# Patient Record
Sex: Female | Born: 1970 | Race: White | Hispanic: No | Marital: Married | Smoking: Current every day smoker
Health system: Southern US, Community
[De-identification: ages and names within clinical notes are randomized; demographics above are authoritative.]

## PROBLEM LIST (undated history)

## (undated) DIAGNOSIS — G43909 Migraine, unspecified, not intractable, without status migrainosus: Secondary | ICD-10-CM

## (undated) DIAGNOSIS — F32A Depression, unspecified: Secondary | ICD-10-CM

## (undated) DIAGNOSIS — J449 Chronic obstructive pulmonary disease, unspecified: Secondary | ICD-10-CM

## (undated) DIAGNOSIS — F419 Anxiety disorder, unspecified: Secondary | ICD-10-CM

## (undated) DIAGNOSIS — M797 Fibromyalgia: Secondary | ICD-10-CM

## (undated) DIAGNOSIS — F410 Panic disorder [episodic paroxysmal anxiety] without agoraphobia: Secondary | ICD-10-CM

## (undated) HISTORY — DX: Depression, unspecified: F32.A

## (undated) HISTORY — PX: AUGMENTATION MAMMAPLASTY: SUR837

## (undated) HISTORY — DX: Chronic obstructive pulmonary disease, unspecified: J44.9

---

## 2009-09-08 ENCOUNTER — Emergency Department: Payer: Self-pay | Admitting: Emergency Medicine

## 2016-01-15 ENCOUNTER — Other Ambulatory Visit: Payer: Self-pay | Admitting: General Practice

## 2016-01-15 ENCOUNTER — Ambulatory Visit
Admission: RE | Admit: 2016-01-15 | Discharge: 2016-01-15 | Disposition: A | Discharge status: Home / Self Care | Payer: Disability Insurance | Source: Ambulatory Visit | Attending: General Practice | Admitting: General Practice

## 2016-01-15 DIAGNOSIS — M797 Fibromyalgia: Secondary | ICD-10-CM

## 2016-01-15 DIAGNOSIS — M7989 Other specified soft tissue disorders: Secondary | ICD-10-CM | POA: Insufficient documentation

## 2016-01-15 DIAGNOSIS — M50322 Other cervical disc degeneration at C5-C6 level: Secondary | ICD-10-CM | POA: Diagnosis not present

## 2016-01-15 DIAGNOSIS — M5136 Other intervertebral disc degeneration, lumbar region: Secondary | ICD-10-CM | POA: Insufficient documentation

## 2016-01-15 DIAGNOSIS — R51 Headache: Secondary | ICD-10-CM | POA: Insufficient documentation

## 2016-05-12 ENCOUNTER — Emergency Department
Admission: EM | Admit: 2016-05-12 | Discharge: 2016-05-13 | Disposition: A | Discharge status: Home / Self Care | Payer: Disability Insurance | Attending: Emergency Medicine | Admitting: Emergency Medicine

## 2016-05-12 ENCOUNTER — Encounter: Payer: Self-pay | Admitting: *Deleted

## 2016-05-12 DIAGNOSIS — F17200 Nicotine dependence, unspecified, uncomplicated: Secondary | ICD-10-CM | POA: Insufficient documentation

## 2016-05-12 DIAGNOSIS — F1320 Sedative, hypnotic or anxiolytic dependence, uncomplicated: Secondary | ICD-10-CM

## 2016-05-12 DIAGNOSIS — Z79899 Other long term (current) drug therapy: Secondary | ICD-10-CM | POA: Insufficient documentation

## 2016-05-12 DIAGNOSIS — Z79891 Long term (current) use of opiate analgesic: Secondary | ICD-10-CM | POA: Insufficient documentation

## 2016-05-12 DIAGNOSIS — F329 Major depressive disorder, single episode, unspecified: Secondary | ICD-10-CM | POA: Insufficient documentation

## 2016-05-12 DIAGNOSIS — F4320 Adjustment disorder, unspecified: Secondary | ICD-10-CM

## 2016-05-12 DIAGNOSIS — M797 Fibromyalgia: Secondary | ICD-10-CM

## 2016-05-12 DIAGNOSIS — F32A Depression, unspecified: Secondary | ICD-10-CM

## 2016-05-12 DIAGNOSIS — F341 Dysthymic disorder: Secondary | ICD-10-CM

## 2016-05-12 DIAGNOSIS — R45851 Suicidal ideations: Secondary | ICD-10-CM

## 2016-05-12 HISTORY — DX: Panic disorder (episodic paroxysmal anxiety): F41.0

## 2016-05-12 HISTORY — DX: Fibromyalgia: M79.7

## 2016-05-12 HISTORY — DX: Anxiety disorder, unspecified: F41.9

## 2016-05-12 HISTORY — DX: Migraine, unspecified, not intractable, without status migrainosus: G43.909

## 2016-05-12 LAB — URINE DRUG SCREEN, QUALITATIVE (ARMC ONLY)
Amphetamines, Ur Screen: NOT DETECTED
BARBITURATES, UR SCREEN: POSITIVE — AB
Benzodiazepine, Ur Scrn: POSITIVE — AB
CANNABINOID 50 NG, UR ~~LOC~~: NOT DETECTED
COCAINE METABOLITE, UR ~~LOC~~: NOT DETECTED
MDMA (ECSTASY) UR SCREEN: NOT DETECTED
METHADONE SCREEN, URINE: NOT DETECTED
Opiate, Ur Screen: POSITIVE — AB
Phencyclidine (PCP) Ur S: NOT DETECTED
TRICYCLIC, UR SCREEN: NOT DETECTED

## 2016-05-12 LAB — COMPREHENSIVE METABOLIC PANEL
ALT: 13 U/L — AB (ref 14–54)
ANION GAP: 5 (ref 5–15)
AST: 15 U/L (ref 15–41)
Albumin: 4 g/dL (ref 3.5–5.0)
Alkaline Phosphatase: 51 U/L (ref 38–126)
BUN: 15 mg/dL (ref 6–20)
CHLORIDE: 100 mmol/L — AB (ref 101–111)
CO2: 31 mmol/L (ref 22–32)
Calcium: 8.8 mg/dL — ABNORMAL LOW (ref 8.9–10.3)
Creatinine, Ser: 0.74 mg/dL (ref 0.44–1.00)
Glucose, Bld: 85 mg/dL (ref 65–99)
POTASSIUM: 3.9 mmol/L (ref 3.5–5.1)
SODIUM: 136 mmol/L (ref 135–145)
Total Bilirubin: 0.3 mg/dL (ref 0.3–1.2)
Total Protein: 6.7 g/dL (ref 6.5–8.1)

## 2016-05-12 LAB — ETHANOL

## 2016-05-12 LAB — CBC
HCT: 39.2 % (ref 35.0–47.0)
HEMOGLOBIN: 13 g/dL (ref 12.0–16.0)
MCH: 27.5 pg (ref 26.0–34.0)
MCHC: 33.2 g/dL (ref 32.0–36.0)
MCV: 82.6 fL (ref 80.0–100.0)
PLATELETS: 167 10*3/uL (ref 150–440)
RBC: 4.75 MIL/uL (ref 3.80–5.20)
RDW: 13.5 % (ref 11.5–14.5)
WBC: 5.5 10*3/uL (ref 3.6–11.0)

## 2016-05-12 LAB — ACETAMINOPHEN LEVEL

## 2016-05-12 LAB — SALICYLATE LEVEL

## 2016-05-12 NOTE — BH Assessment (Signed)
Assessment Note  Felicia [name] is an 45 y.o. female presenting to the ED under IVC, initiated by [facility] psychiatry,  for suicidal thoughts. She denies suicidal ideations ans states she has not had a suicide attempt in 10 years.  Pt reports she is feeling overwhelmed and anxious because of multiple stressors.  She reports her niece died from cancer.  She also reports she is facing foreclosure and no longer has a job or insurance. She denies any auditory/visual hallucinations.  Diagnosis: Anxiety  Past Medical History:  Past Medical History  Diagnosis Date  . Fibromyalgia   . Anxiety   . Panic disorder   . Migraine     History reviewed. No pertinent past surgical history.  Family History: History reviewed. No pertinent family history.  Social History:  reports that she has been smoking.  She does not have any smokeless tobacco history on file. Her alcohol and drug histories are not on file.  Additional Social History:  Alcohol / Drug Use History of alcohol / drug use?: No history of alcohol / drug abuse (Pt denies)  CIWA: CIWA-Ar BP: 104/80 mmHg Pulse Rate: 65 COWS:    Allergies: No Known Allergies  Home Medications:  (Not in a hospital admission)  OB/GYN Status:  Patient's last menstrual period was 04/26/2016.  General Assessment Data Location of Assessment: [facility] ED TTS Assessment: In system Is this a Tele or Face-to-Face Assessment?: Face-to-Face Is this an Initial Assessment or a Re-assessment for this encounter?: Initial Assessment Marital status: Single Maiden name: N/A Is patient pregnant?: No Pregnancy Status: No Living Arrangements: Alone Can pt return to current living arrangement?: Yes Admission Status: Involuntary Is patient capable of signing voluntary admission?: Yes Referral Source: Psychiatrist Insurance type: Self pay  Medical Screening Exam ([place]) Medical Exam completed: Yes  Crisis Care Plan Living Arrangements: Alone Legal Guardian:  Other: (self) Name of Psychiatrist: Nina Name of Therapist: [facility]  Education Status Is patient currently in school?: No Current Grade: N/A Highest grade of school patient has completed: N/A Name of school: N/A Contact person: N/A  Risk to self with the past 6 months Suicidal Ideation: No Has patient been a risk to self within the past 6 months prior to admission? : No Suicidal Intent: No Has patient had any suicidal intent within the past 6 months prior to admission? : No Is patient at risk for suicide?: No Suicidal Plan?: No Has patient had any suicidal plan within the past 6 months prior to admission? : No Access to Means: No What has been your use of drugs/alcohol within the last 12 months?: None identified Previous Attempts/Gestures: Yes How many times?: 1 Other Self Harm Risks: None identified Triggers for Past Attempts: None known Intentional Self Injurious Behavior: None Family Suicide History: No Recent stressful life event(s): Financial Problems, Job Loss, Loss (Comment) Persecutory voices/beliefs?: No Depression: Yes Depression Symptoms: Despondent, Insomnia, Tearfulness, Loss of interest in usual pleasures, Feeling worthless/self pity Substance abuse history and/or treatment for substance abuse?: No Suicide prevention information given to non-admitted patients: Not applicable  Risk to Others within the past 6 months Homicidal Ideation: No Does patient have any lifetime risk of violence toward others beyond the six months prior to admission? : No Thoughts of Harm to Others: No Current Homicidal Intent: No Current Homicidal Plan: No Access to Homicidal Means: No Identified Victim: None identified History of harm to others?: No Assessment of Violence: None Noted Violent Behavior Description: None identified Does patient have access to weapons?:  No Criminal Charges Pending?: No Does patient have a court date: No Is patient on probation?:  No  Psychosis Hallucinations: None noted Delusions: None noted  Mental Status Report Appearance/Hygiene: In scrubs Eye Contact: Fair Motor Activity: Freedom of movement Speech: Logical/coherent Level of Consciousness: Alert, Crying Mood: Depressed, Anxious Affect: Anxious, Depressed Anxiety Level: Minimal Thought Processes: Coherent, Relevant Judgement: Partial Orientation: Person, Place, Time, Situation, Appropriate for developmental age Obsessive Compulsive Thoughts/Behaviors: None  Cognitive Functioning Concentration: Normal Memory: Recent Intact, Remote Intact IQ: Average Insight: Good Impulse Control: Good Appetite: Fair Weight Loss: 0 Weight Gain: 0 Sleep: Decreased Total Hours of Sleep: 4 Vegetative Symptoms: None  ADLScreening [facility] Assessment Services) Patient's cognitive ability adequate to safely complete daily activities?: Yes Patient able to express need for assistance with ADLs?: Yes Independently performs ADLs?: Yes (appropriate for developmental age)  Prior Inpatient Therapy Prior Inpatient Therapy: Yes Prior Therapy Dates: 2007 Prior Therapy Facilty/Provider(s): [facility] Reason for Treatment: suicide attempt  Prior Outpatient Therapy Prior Outpatient Therapy: Yes Prior Therapy Dates: current Prior Therapy Facilty/Provider(s): [facility] Reason for Treatment: anxiety Does patient have an ACCT team?: No Does patient have Intensive In-House Services?  : No Does patient have [facility] services? : No Does patient have P4CC services?: No  ADL Screening (condition at time of admission) Patient's cognitive ability adequate to safely complete daily activities?: Yes Patient able to express need for assistance with ADLs?: Yes Independently performs ADLs?: Yes (appropriate for developmental age)       Abuse/Neglect Assessment (Assessment to be complete while patient is alone) Physical Abuse: Denies Verbal Abuse: Denies Sexual Abuse: Denies Exploitation of  patient/patient's resources: Denies Self-Neglect: Denies Values / Beliefs Cultural Requests During Hospitalization: None Spiritual Requests During Hospitalization: None Consults Spiritual Care Consult Needed: No Social Work Consult Needed: No      Additional Information 1:1 In Past 12 Months?: No CIRT Risk: No Elopement Risk: No Does patient have medical clearance?: Yes     Disposition:  Disposition Initial Assessment Completed for this Encounter: Yes Disposition of Patient: Other dispositions Other disposition(s): Other (Comment) (Pending Psych MD consult)  On Site Evaluation by:   Reviewed with Physician:    Oneita Hurt 05/12/2016 9:44 PM

## 2016-05-12 NOTE — ED Notes (Signed)
Arrives IVC from [facility] with BPD, pt states she said "I dont want to deal with anything anymore", denies SI at present and states she didn't mean she wanted to kill herself when she said it, states she has a court appearance next week and does not want to miss it, denies HI, denies ETOH and drug abuse

## 2016-05-12 NOTE — ED Notes (Signed)
Dr. McShane at bedside.  

## 2016-05-12 NOTE — ED Notes (Signed)
TTS at bedside. 

## 2016-05-12 NOTE — ED Notes (Signed)
Pt. To BHU from ED ambulatory without difficulty, to room  BHU-5. Report from [facility]. Pt. Is alert and oriented, warm and dry in no distress. Pt. Denies SI, HI, and AVH. Pt. Calm and cooperative. Pt. Made aware of security cameras and Q15 minute rounds. Pt. Encouraged to let Nursing staff know of any concerns or needs.

## 2016-05-12 NOTE — ED Notes (Signed)
Pt dressed out, clothes and cell phone placed in belongings bag

## 2016-05-12 NOTE — ED Provider Notes (Signed)
[facility] Emergency Department Provider Note  ____________________________________________   I have reviewed the triage vital signs and the nursing notes.   HISTORY  Chief Complaint Suicidal    HPI Felicia [name] is a 45 y.o. female with a history of prior suicide attempt anxiety fibromyalgia and depression. She was sent here under IVC by psychiatry for suicidal thoughts. She has not try to kill herself and last 10 years she states. She states she does have passive thoughts of changing places with her niece who died from cancer, she would not endorse active suicidal thoughts but she does admit that she is overwhelmed and anxious all the time. Patient is trying to get on disability for fibromyalgia and anxiety apparently, she is facing foreclosure because he is not working and has numerous life stresses.      Past Medical History  Diagnosis Date  . Fibromyalgia   . Anxiety   . Panic disorder   . Migraine     There are no active problems to display for this patient.   History reviewed. No pertinent past surgical history.  No current outpatient prescriptions on file.  Allergies Review of patient's allergies indicates no known allergies.  History reviewed. No pertinent family history.  Social History Social History  Substance Use Topics  . Smoking status: Current Every Day Smoker  . Smokeless tobacco: None  . Alcohol Use: None    Review of Systems Constitutional: No fever/chills Eyes: No visual changes. ENT: No sore throat. No stiff neck no neck pain Cardiovascular: Denies chest pain. Respiratory: Denies shortness of breath. Gastrointestinal:   no vomiting.  No diarrhea.  No constipation. Genitourinary: Negative for dysuria. Musculoskeletal: Negative lower extremity swelling Skin: Negative for rash. Neurological: Negative for headaches, focal weakness or numbness. 10-point ROS otherwise  negative.  ____________________________________________   PHYSICAL EXAM:  VITAL SIGNS: ED Triage Vitals  Enc Vitals Group     BP 05/12/16 1818 118/78 mmHg     Pulse Rate 05/12/16 1818 73     Resp 05/12/16 1818 18     Temp 05/12/16 1818 98.3 F (36.8 C)     Temp Source 05/12/16 1818 Oral     SpO2 05/12/16 1818 99 %     Weight 05/12/16 1818 140 lb (63.504 kg)     Height 05/12/16 1818 5\' 7"  (1.702 m)     Head Cir --      Peak Flow --      Pain Score --      Pain Loc --      Pain Edu? --      Excl. in [place]? --     Constitutional: Alert and oriented. Well appearing and in no acute distress. Eyes: Conjunctivae are normal. PERRL. EOMI. Head: Atraumatic. Nose: No congestion/rhinnorhea. Mouth/Throat: Mucous membranes are moist.  Oropharynx non-erythematous. Neck: No stridor.   Nontender with no meningismus Cardiovascular: Normal rate, regular rhythm. Grossly normal heart sounds.  Good peripheral circulation. Respiratory: Normal respiratory effort.  No retractions. Lungs CTAB. Abdominal: Soft and nontender. No distention. No guarding no rebound Back:  There is no focal tenderness or step off there is no midline tenderness there are no lesions noted. there is no CVA tenderness Musculoskeletal: No lower extremity tenderness. No joint effusions, no DVT signs strong distal pulses no edema Neurologic:  Normal speech and language. No gross focal neurologic deficits are appreciated.  Skin:  Skin is warm, dry and intact. No rash noted. Psychiatric: Mood and affect are Flat. Speech and  behavior are flat affect and somewhat pressured speech.  ____________________________________________   LABS (all labs ordered are listed, but only abnormal results are displayed)  Labs Reviewed  COMPREHENSIVE METABOLIC PANEL - Abnormal; Notable for the following:    Chloride 100 (*)    Calcium 8.8 (*)    ALT 13 (*)    All other components within normal limits  ACETAMINOPHEN LEVEL - Abnormal; Notable  for the following:    Acetaminophen (Tylenol), Serum <10 (*)    All other components within normal limits  ETHANOL  SALICYLATE LEVEL  CBC  URINE DRUG SCREEN, QUALITATIVE ([facility] ONLY)   ____________________________________________  EKG  I personally interpreted any EKGs ordered by me or triage  ____________________________________________  RADIOLOGY  I reviewed any imaging ordered by me or triage that were performed during my shift and, if possible, patient and/or family made aware of any abnormal findings. ____________________________________________   PROCEDURES  Procedure(s) performed: None  Critical Care performed: None  ____________________________________________   INITIAL IMPRESSION / ASSESSMENT AND PLAN / ED COURSE  Pertinent labs & imaging results that were available during my care of the patient were reviewed by me and considered in my medical decision making (see chart for details).  Patient under IVC by outpatient psychiatry for suicidal thoughts endorses being overwhelmed and over, stress and anxiety to me. Patient IVC will be continued pending psychiatric disposition. ____________________________________________   FINAL CLINICAL IMPRESSION(S) / ED DIAGNOSES  Final diagnoses:  None      This chart was dictated using voice recognition software.  Despite best efforts to proofread,  errors can occur which can change meaning.     Schuyler Amor, MD 05/12/16 [phone]

## 2016-05-13 DIAGNOSIS — F4323 Adjustment disorder with mixed anxiety and depressed mood: Secondary | ICD-10-CM

## 2016-05-13 DIAGNOSIS — F341 Dysthymic disorder: Secondary | ICD-10-CM

## 2016-05-13 DIAGNOSIS — F4320 Adjustment disorder, unspecified: Secondary | ICD-10-CM

## 2016-05-13 DIAGNOSIS — M797 Fibromyalgia: Secondary | ICD-10-CM

## 2016-05-13 DIAGNOSIS — R45851 Suicidal ideations: Secondary | ICD-10-CM

## 2016-05-13 DIAGNOSIS — F1320 Sedative, hypnotic or anxiolytic dependence, uncomplicated: Secondary | ICD-10-CM

## 2016-05-13 MED ORDER — IBUPROFEN 800 MG PO TABS
800.0000 mg | ORAL_TABLET | Freq: Once | ORAL | Status: AC
  Administered 2016-05-13: 800 mg via ORAL
  Filled 2016-05-13: qty 1

## 2016-05-13 NOTE — ED Notes (Signed)
Patient is relieved that she will be able to be discharged from the hospital today. No signs of acute distress at this time. Patient says that her headache and nausea is better. Maintained on 15 minute checks and observation by security camera for safety.

## 2016-05-13 NOTE — ED Notes (Signed)
Patient resting quietly in room. No noted distress or abnormal behaviors noted. Will continue 15 minute checks and observation by security camera for safety. 

## 2016-05-13 NOTE — ED Provider Notes (Signed)
-----------------------------------------   6:22 AM on 05/13/2016 -----------------------------------------   Blood pressure 104/80, pulse 65, temperature 98.3 F (36.8 C), temperature source Oral, resp. rate 17, height 5\' 7"  (1.702 m), weight 140 lb (63.504 kg), last menstrual period 04/26/2016, SpO2 99 %.  The patient had no acute events since last update.  Calm and cooperative at this time.  Disposition is pending per Psychiatry/Behavioral Medicine team recommendations.     Paulette Blanch, MD 05/13/16 [phone]

## 2016-05-13 NOTE — Discharge Instructions (Signed)
Major Depressive Disorder °Major depressive disorder is a mental illness. It also may be called clinical depression or unipolar depression. Major depressive disorder usually causes feelings of sadness, hopelessness, or helplessness. Some people with this disorder do not feel particularly sad but lose interest in doing things they used to enjoy (anhedonia). Major depressive disorder also can cause physical symptoms. It can interfere with work, school, relationships, and other normal everyday activities. The disorder varies in severity but is longer lasting and more serious than the sadness we all feel from time to time in our lives. °Major depressive disorder often is triggered by stressful life events or major life changes. Examples of these triggers include divorce, loss of your job or home, a move, and the death of a family member or close friend. Sometimes this disorder occurs for no obvious reason at all. People who have family members with major depressive disorder or bipolar disorder are at higher risk for developing this disorder, with or without life stressors. Major depressive disorder can occur at any age. It may occur just once in your life (single episode major depressive disorder). It may occur multiple times (recurrent major depressive disorder). °SYMPTOMS °People with major depressive disorder have either anhedonia or depressed mood on nearly a daily basis for at least 2 weeks or longer. Symptoms of depressed mood include: °· Feelings of sadness (blue or down in the dumps) or emptiness. °· Feelings of hopelessness or helplessness. °· Tearfulness or episodes of crying (may be observed by others). °· Irritability (children and adolescents). °In addition to depressed mood or anhedonia or both, people with this disorder have at least four of the following symptoms: °· Difficulty sleeping or sleeping too much.   °· Significant change (increase or decrease) in appetite or weight.   °· Lack of energy or  motivation. °· Feelings of guilt and worthlessness.   °· Difficulty concentrating, remembering, or making decisions. °· Unusually slow movement (psychomotor retardation) or restlessness (as observed by others).   °· Recurrent wishes for death, recurrent thoughts of self-harm (suicide), or a suicide attempt. °People with major depressive disorder commonly have persistent negative thoughts about themselves, other people, and the world. People with severe major depressive disorder may experience distorted beliefs or perceptions about the world (psychotic delusions). They also may see or hear things that are not real (psychotic hallucinations). °DIAGNOSIS °Major depressive disorder is diagnosed through an assessment by your health care provider. Your health care provider will ask about aspects of your daily life, such as mood, sleep, and appetite, to see if you have the diagnostic symptoms of major depressive disorder. Your health care provider may ask about your medical history and use of alcohol or drugs, including prescription medicines. Your health care provider also may do a physical exam and blood work. This is because certain medical conditions and the use of certain substances can cause major depressive disorder-like symptoms (secondary depression). Your health care provider also may refer you to a mental health specialist for further evaluation and treatment. °TREATMENT °It is important to recognize the symptoms of major depressive disorder and seek treatment. The following treatments can be prescribed for this disorder:   °· Medicine. Antidepressant medicines usually are prescribed. Antidepressant medicines are thought to correct chemical imbalances in the brain that are commonly associated with major depressive disorder. Other types of medicine may be added if the symptoms do not respond to antidepressant medicines alone or if psychotic delusions or hallucinations occur. °· Talk therapy. Talk therapy can be  helpful in treating major depressive disorder by providing   support, education, and guidance. Certain types of talk therapy also can help with negative thinking (cognitive behavioral therapy) and with relationship issues that trigger this disorder (interpersonal therapy). A mental health specialist can help determine which treatment is best for you. Most people with major depressive disorder do well with a combination of medicine and talk therapy. Treatments involving electrical stimulation of the brain can be used in situations with extremely severe symptoms or when medicine and talk therapy do not work over time. These treatments include electroconvulsive therapy, transcranial magnetic stimulation, and vagal nerve stimulation.   This information is not intended to replace advice given to you by your health care provider. Make sure you discuss any questions you have with your health care provider.   Document Released: 04/08/2013 Document Revised: 01/02/2015 Document Reviewed: 04/08/2013 Elsevier Interactive Patient Education [employer].    Please follow-up with [facility] return if you're worse again.

## 2016-05-13 NOTE — Consult Note (Signed)
[facility] Consult   Reason for Consult:  Consult for this 45 year old woman referred from [place] because of supposedly suicidal ideation Referring Physician:  Cinda Quest Patient Identification: JENNEL MARA MRN:  [id number] Principal Diagnosis: Adjustment disorder Diagnosis:   Patient Active Problem List   Diagnosis Date Noted  . Dysthymia [F34.1] 05/13/2016  . Adjustment disorder [F43.20] 05/13/2016  . 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 Suicidal ideation [R45.851] 05/13/2016  . Fibromyalgia [M79.7] 05/13/2016    Total Time spent with patient: 1 hour  Subjective:   NASRA COUNCE is a 45 y.o. female patient admitted with "there was a lack of communication".  HPI:  45 year old woman referred here from [place] with reports that she had suicidal ideation. Patient said that she went to see a mental health professional for her disability evaluation earlier this week. They advised her to go to [facility] to see a Social worker. She went there yesterday and spoke to an intake person. She at that time checked off on a box that she had had suicidal ideation. They committed her here to the hospital saying that she had active suicidal thoughts. Patient denies that she said that. She denies having any active suicidal intent or wish or plan. She says that her mood feels down and stressed and has been a little more stressed recently. Multiple problems including having to go to foreclosure court, not being able to work currently, Langley Gauss recently died, she is worried about her daughter and her granddaughter. She says she sleeps okay with her current medicine. Appetite is okay. Denies any hallucinations. She has fibromyalgia and has chronic pain all over. Complains of chronic difficulty with short-term memory. She is on medications that include Lyrica, Cymbalta, MS Contin, Xanax, Fioricet. She says she is compliant with these medicines. Denies that she is abusing alcohol or using any other drugs  abusively.  Social history: Patient lives with her daughter and her granddaughter. She is worried about foreclosure. She has not been able to work in a year because of her difficulty with memory and her fibromyalgia.  Medical history: Diagnosis of fibromyalgia  Substance abuse history: Patient says that she used to drink regularly but hasn't had any alcohol in years. Denies that she abuses any other drugs. She does not seem to have a clear understanding of the a Norma's amount of sedating controlled substance that she is currently taking.  Past Psychiatric History: Patient is not currently seeing a mental health provider. She gets her psychiatric follow-up from her primary care doctor. She is taking Xanax and Cymbalta. She has seen a psychiatrist in the past but it was years ago. She has had one prior suicide attempt by overdose 10 years ago. She had one prior inpatient hospitalization at that time. Previous medications include Zoloft.  Risk to Self: Suicidal Ideation: No Suicidal Intent: No Is patient at risk for suicide?: No Suicidal Plan?: No Access to Means: No What has been your use of drugs/alcohol within the last 12 months?: None identified How many times?: 1 Other Self Harm Risks: None identified Triggers for Past Attempts: None known Intentional Self Injurious Behavior: None Risk to Others: Homicidal Ideation: No Thoughts of Harm to Others: No Current Homicidal Intent: No Current Homicidal Plan: No Access to Homicidal Means: No Identified Victim: None identified History of harm to others?: No Assessment of Violence: None Noted Violent Behavior Description: None identified Does patient have access to weapons?: No Criminal Charges Pending?: No Does patient have a court date: No  Prior Inpatient Therapy: Prior Inpatient Therapy: Yes Prior Therapy Dates: 2007 Prior Therapy Facilty/Provider(s): [facility] Reason for Treatment: suicide attempt Prior Outpatient Therapy: Prior  Outpatient Therapy: Yes Prior Therapy Dates: current Prior Therapy Facilty/Provider(s): [facility] Reason for Treatment: anxiety Does patient have an ACCT team?: No Does patient have Intensive In-House Services?  : No Does patient have [facility] services? : No Does patient have P4CC services?: No  Past Medical History:  Past Medical History  Diagnosis Date  . Fibromyalgia   . Anxiety   . Panic disorder   . Migraine    History reviewed. No pertinent past surgical history. Family History: History reviewed. No pertinent family history. Family Psychiatric  History: Patient says she had a father who had depression. No family history of suicide Social History:  History  Alcohol Use: Not on file     History  Drug Use Not on file    Social History   Social History  . Marital Status: Married    Spouse Name: N/A  . Number of Children: N/A  . Years of Education: N/A   Social History Main Topics  . Smoking status: Current Every Day Smoker  . Smokeless tobacco: None  . Alcohol Use: None  . Drug Use: None  . Sexual Activity: Not Asked   Other Topics Concern  . None   Social History Narrative  . None   Additional Social History:    Allergies:  No Known Allergies  Labs:  Results for orders placed or performed during the hospital encounter of 05/12/16 (from the past 48 hour(s))  Comprehensive metabolic panel     Status: Abnormal   Collection Time: 05/12/16  6:21 PM  Result Value Ref Range   Sodium 136 135 - 145 mmol/L   Potassium 3.9 3.5 - 5.1 mmol/L   Chloride 100 (L) 101 - 111 mmol/L   CO2 31 22 - 32 mmol/L   Glucose, Bld 85 65 - 99 mg/dL   BUN 15 6 - 20 mg/dL   Creatinine, Ser 0.74 0.44 - 1.00 mg/dL   Calcium 8.8 (L) 8.9 - 10.3 mg/dL   Total Protein 6.7 6.5 - 8.1 g/dL   Albumin 4.0 3.5 - 5.0 g/dL   AST 15 15 - 41 U/L   ALT 13 (L) 14 - 54 U/L   Alkaline Phosphatase 51 38 - 126 U/L   Total Bilirubin 0.3 0.3 - 1.2 mg/dL   GFR calc non Af Amer >60 >60 mL/min   GFR calc Af  Amer >60 >60 mL/min    Comment: (NOTE) The eGFR has been calculated using the CKD EPI equation. This calculation has not been validated in all clinical situations. eGFR's persistently <60 mL/min signify possible Chronic Kidney Disease.    Anion gap 5 5 - 15  Ethanol     Status: None   Collection Time: 05/12/16  6:21 PM  Result Value Ref Range   Alcohol, Ethyl (B) <5 <5 mg/dL    Comment:        LOWEST DETECTABLE LIMIT FOR SERUM ALCOHOL IS 5 mg/dL FOR MEDICAL PURPOSES ONLY   Salicylate level     Status: None   Collection Time: 05/12/16  6:21 PM  Result Value Ref Range   Salicylate Lvl <8.6 2.8 - 30.0 mg/dL  Acetaminophen level     Status: Abnormal   Collection Time: 05/12/16  6:21 PM  Result Value Ref Range   Acetaminophen (Tylenol), Serum <10 (L) 10 - 30 ug/mL    Comment:  THERAPEUTIC CONCENTRATIONS VARY SIGNIFICANTLY. A RANGE OF 10-30 ug/mL MAY BE AN EFFECTIVE CONCENTRATION FOR MANY PATIENTS. HOWEVER, SOME ARE BEST TREATED AT CONCENTRATIONS OUTSIDE THIS RANGE. ACETAMINOPHEN CONCENTRATIONS >150 ug/mL AT 4 HOURS AFTER INGESTION AND >50 ug/mL AT 12 HOURS AFTER INGESTION ARE OFTEN ASSOCIATED WITH TOXIC REACTIONS.   cbc     Status: None   Collection Time: 05/12/16  6:21 PM  Result Value Ref Range   WBC 5.5 3.6 - 11.0 K/uL   RBC 4.75 3.80 - 5.20 MIL/uL   Hemoglobin 13.0 12.0 - 16.0 g/dL   HCT 39.2 35.0 - 47.0 %   MCV 82.6 80.0 - 100.0 fL   MCH 27.5 26.0 - 34.0 pg   MCHC 33.2 32.0 - 36.0 g/dL   RDW 13.5 11.5 - 14.5 %   Platelets 167 150 - 440 K/uL  Urine Drug Screen, Qualitative     Status: Abnormal   Collection Time: 05/12/16  6:21 PM  Result Value Ref Range   Tricyclic, Ur Screen NONE DETECTED NONE DETECTED   Amphetamines, Ur Screen NONE DETECTED NONE DETECTED   MDMA (Ecstasy)Ur Screen NONE DETECTED NONE DETECTED   Cocaine Metabolite,Ur [place] NONE DETECTED NONE DETECTED   Opiate, Ur Screen POSITIVE (A) NONE DETECTED   Phencyclidine (PCP) Ur S NONE DETECTED  NONE DETECTED   Cannabinoid 50 Ng, Ur [place] NONE DETECTED NONE DETECTED   Barbiturates, Ur Screen POSITIVE (A) NONE DETECTED   Benzodiazepine, Ur Scrn POSITIVE (A) NONE DETECTED   Methadone Scn, Ur NONE DETECTED NONE DETECTED    Comment: (NOTE) 161  Tricyclics, urine               Cutoff 1000 ng/mL 200  Amphetamines, urine             Cutoff 1000 ng/mL 300  MDMA (Ecstasy), urine           Cutoff 500 ng/mL 400  Cocaine Metabolite, urine       Cutoff 300 ng/mL 500  Opiate, urine                   Cutoff 300 ng/mL 600  Phencyclidine (PCP), urine      Cutoff 25 ng/mL 700  Cannabinoid, urine              Cutoff 50 ng/mL 800  Barbiturates, urine             Cutoff 200 ng/mL 900  Benzodiazepine, urine           Cutoff 200 ng/mL 1000 Methadone, urine                Cutoff 300 ng/mL 1100 1200 The urine drug screen provides only a preliminary, unconfirmed 1300 analytical test result and should not be used for non-medical 1400 purposes. Clinical consideration and professional judgment should 1500 be applied to any positive drug screen result due to possible 1600 interfering substances. A more specific alternate chemical method 1700 must be used in order to obtain a confirmed analytical result.  1800 Gas chromato graphy / mass spectrometry (GC/MS) is the preferred 1900 confirmatory method.     No current facility-administered medications for this encounter.   Current Outpatient Prescriptions  Medication Sig Dispense Refill  . ALPRAZolam (XANAX XR) 2 MG 24 hr tablet Take 2 mg by mouth once.    . butalbital-acetaminophen-caffeine (FIORICET, ESGIC) 50-325-40 MG tablet Take 1 tablet by mouth every 12 (twelve) hours as needed for headache.    . DULoxetine (CYMBALTA) 60 MG  capsule Take 60 mg by mouth daily.    Marland Kitchen morphine (MSIR) 30 MG tablet Take 30 mg by mouth every 4 (four) hours as needed for severe pain.    . pregabalin (LYRICA) 150 MG capsule Take 150 mg by mouth 2 (two) times daily.       Musculoskeletal: Strength & Muscle Tone: decreased Gait & Station: normal Patient leans: N/A  Psychiatric Specialty Exam: Review of Systems  Constitutional: Negative.   HENT: Negative.   Eyes: Negative.   Respiratory: Negative.   Cardiovascular: Negative.   Gastrointestinal: Negative.   Musculoskeletal: Positive for myalgias.  Skin: Negative.   Neurological: Negative.   Psychiatric/Behavioral: Positive for depression and memory loss. Negative for suicidal ideas, hallucinations and substance abuse. The patient is nervous/anxious. The patient does not have insomnia.     Blood pressure 91/57, pulse 52, temperature 98 F (36.7 C), temperature source Oral, resp. rate 18, height _0  (1.702 m), weight 63.504 kg (140 lb), last menstrual period 04/26/2016, SpO2 98 %.Body mass index is 21.92 kg/(m^2).  General Appearance: Disheveled  Eye Contact::  Minimal  Speech:  Slow  Volume:  Decreased  Mood:  Dysphoric  Affect:  Constricted  Thought Process:  Goal Directed  Orientation:  Full (Time, Place, and Person)  Thought Content:  Negative  Suicidal Thoughts:  No  Homicidal Thoughts:  No  Memory:  Immediate;   Good Recent;   Fair Remote;   Fair  Judgement:  Fair  Insight:  Fair  Psychomotor Activity:  Decreased  Concentration:  Fair  Recall:  [employer] of Knowledge:Fair  Language: Fair  Akathisia:  No  Handed:  Right  AIMS (if indicated):     Assets:  Communication Skills Desire for Improvement Housing Resilience  ADL's:  Impaired  Cognition: WNL  Sleep:      Treatment Plan Summary: Plan 45 year old woman who is now denying any suicidal thoughts at all. No evidence that she did anything to try and hurt herself. Patient is not reporting any psychotic symptoms. She Artie has mental health treatment in place and is agreeable to getting further mental health treatment. Patient does not meet commitment criteria. Patient is taking a large amount of very sedating medication.  It is no apprised to me that she has memory problems and she is taking narcotics benzodiazepines and barbiturates on a regular basis as well as her Lyrica. I educated her about this and strongly encouraged that she talk to her doctor and see a psychiatrist about perhaps getting off of some of this medicine since it is probably keeping her sedated. Patient will be taken off IVC. She will be encouraged to go back to follow-up at [facility]. No further inpatient treatment needed at this time.  Disposition: Patient does not meet criteria for psychiatric inpatient admission. Supportive therapy provided about ongoing stressors.  Alethia Berthold, MD 05/13/2016 2:13 PM

## 2016-05-13 NOTE — ED Notes (Signed)
Patient denies SI/HI/AVH. Patient says that she has been feeling depressed lately due to her upcoming forclosure. She adamantly denies that she wants to kill herself and says that her IVC was just a misunderstanding, and wishes to be discharged from the hospital. Patient also complained of headache rated 10/10 and nausea which was addressed with 800 mg ibuprofen and ginger ale. Patient is pleasant and cooperative. Compliant with all scheduled medications. Maintained on 15 minute checks and observation by security camera for safety.

## 2016-05-13 NOTE — ED Notes (Signed)
Patient daughter called to check on patient. Per patient's permission, Probation officer informed daughter that she was waiting to see the psychiatrist and that she would give her a call if she were to be discharged. Daughter had no further questions. Maintained on 15 minute checks and observation by security camera for safety.

## 2016-05-13 NOTE — ED Notes (Signed)
ENVIRONMENTAL ASSESSMENT Potentially harmful objects out of patient reach: Yes Personal belongings secured: Yes Patient dressed in hospital provided attire only: Yes Plastic bags out of patient reach: Yes Patient care equipment (cords, cables, call bells, lines, and drains) shortened, removed, or accounted for: Yes Equipment and supplies removed from bottom of stretcher: Yes Potentially toxic materials out of patient reach: Yes Sharps container removed or out of patient reach: Yes  Patient currently in room sleeping. No signs of distress noted at this time. Maintained on 15 minute checks and observation by security camera for safety.

## 2016-05-13 NOTE — ED Notes (Signed)
Patient denies SI/HI/AVH and pain. All belongings returned to patient. Patient signed belongings inventory sheet. Discharge instructions reviewed. Patient discharged home to daughter.

## 2016-05-13 NOTE — Progress Notes (Signed)
LCSW met with patient and provided her with Felicia [name], [facility] walk-in clinic information and suicide prevention and intervention handout. Patient reports she is not homicidal or suicidal at this time. Patient is able to contract for safety at this time. LCSW reviewed immediate crisis plan with patient. No further needs at this time.  [employer] LCSW [phone]

## 2016-05-13 NOTE — ED Notes (Signed)
Patient asleep in room. No noted distress or abnormal behavior. Will continue 15 minute checks and observation by security cameras for safety. 

## 2016-05-13 NOTE — ED Notes (Signed)
Patient in room resting. No signs of distress noted. Maintained on 15 minute checks and observation by security camera for safety.

## 2016-08-09 ENCOUNTER — Encounter (HOSPITAL_COMMUNITY): Payer: Self-pay | Admitting: *Deleted

## 2016-08-23 ENCOUNTER — Other Ambulatory Visit: Payer: Self-pay | Admitting: Obstetrics and Gynecology

## 2016-08-23 ENCOUNTER — Encounter (HOSPITAL_COMMUNITY): Payer: Self-pay | Admitting: *Deleted

## 2016-08-23 DIAGNOSIS — Z1231 Encounter for screening mammogram for malignant neoplasm of breast: Secondary | ICD-10-CM

## 2016-09-01 ENCOUNTER — Other Ambulatory Visit: Payer: Self-pay | Admitting: Obstetrics and Gynecology

## 2016-09-01 ENCOUNTER — Encounter (HOSPITAL_COMMUNITY): Payer: Self-pay

## 2016-09-01 ENCOUNTER — Ambulatory Visit (HOSPITAL_COMMUNITY)
Admission: RE | Admit: 2016-09-01 | Discharge: 2016-09-01 | Disposition: A | Discharge status: Home / Self Care | Payer: Self-pay | Source: Ambulatory Visit | Attending: Obstetrics and Gynecology | Admitting: Obstetrics and Gynecology

## 2016-09-01 ENCOUNTER — Ambulatory Visit
Admission: RE | Admit: 2016-09-01 | Discharge: 2016-09-01 | Disposition: A | Discharge status: Home / Self Care | Payer: No Typology Code available for payment source | Source: Ambulatory Visit | Attending: Obstetrics and Gynecology | Admitting: Obstetrics and Gynecology

## 2016-09-01 VITALS — BP 106/64 | Temp 98.6°F | Ht 67.0 in | Wt 138.0 lb

## 2016-09-01 DIAGNOSIS — Z1231 Encounter for screening mammogram for malignant neoplasm of breast: Secondary | ICD-10-CM

## 2016-09-01 DIAGNOSIS — Z1239 Encounter for other screening for malignant neoplasm of breast: Secondary | ICD-10-CM

## 2016-09-01 NOTE — Progress Notes (Signed)
No complaints today.   Pap Smear: Pap smear not completed today. Last Pap smear was 08/05/2016 at the [facility] and normal with negative HPV. Per patient has no history of an abnormal Pap smear. Last Pap smear result is in EPIC.  Physical exam: Breasts Breasts symmetrical. No skin abnormalities bilateral breasts. No nipple retraction bilateral breasts. No nipple discharge bilateral breasts. No lymphadenopathy. No lumps palpated bilateral breasts. No complaints of pain or tenderness on exam. Referred patient to the [facility] for a screening mammogram. Appointment scheduled for Thursday, September 01, 2016 at 1240.        Pelvic/Bimanual No Pap smear completed today since last Pap smear was 08/05/2016. Pap smear not indicated per BCCCP guidelines.   Smoking History: Patient is a current smoker. Discussed smoking cessation with patient. Referred patient to the [facility] Quitline and gave resources to free smoking cessation classes at [facility].  Patient Navigation: Patient education provided. Access to services provided for patient through [facility] program.

## 2016-09-01 NOTE — Patient Instructions (Addendum)
Explained breast self awareness to Arna Medici. Patient did not need a Pap smear today due to last Pap smear was 08/05/2016. Let her know BCCCP will cover Pap smears and HPV typing every 5 years unless has a history of abnormal Pap smears. Referred patient to the [facility] for a screening mammogram. Appointment scheduled for Thursday, September 01, 2016 at 1240. Let patient know the [facility] will follow up with her within the next couple weeks with results of mammogram by letter or phone. Discussed smoking cessation with patient. Referred patient to the [facility] Quitline and gave resources to free smoking cessation classes at [facility]. Arna Medici verbalized understanding.  Irwin Toran, Arvil Chaco, RN 10:41 AM

## 2016-09-02 ENCOUNTER — Encounter (HOSPITAL_COMMUNITY): Payer: Self-pay | Admitting: *Deleted

## 2016-09-06 ENCOUNTER — Telehealth (HOSPITAL_COMMUNITY): Payer: Self-pay | Admitting: *Deleted

## 2016-09-06 NOTE — Telephone Encounter (Signed)
Telephoned patient at home number and advised patient would need to call the [facility] Department at [phone] and schedule a physical to get IUD removed. Patient voiced understanding.

## 2018-01-12 ENCOUNTER — Ambulatory Visit: Payer: No Typology Code available for payment source

## 2018-01-12 ENCOUNTER — Other Ambulatory Visit: Payer: Self-pay

## 2018-01-12 ENCOUNTER — Encounter (INDEPENDENT_AMBULATORY_CARE_PROVIDER_SITE_OTHER): Payer: Self-pay

## 2018-01-12 ENCOUNTER — Ambulatory Visit: Payer: No Typology Code available for payment source | Admitting: Pharmacy Technician

## 2018-01-12 DIAGNOSIS — Z79899 Other long term (current) drug therapy: Secondary | ICD-10-CM

## 2018-01-12 NOTE — Progress Notes (Signed)
Completed [facility] application and contract.  Patient agreed to all terms of the [facility] contract.    Patient approved to receive medication assistance at [facility] through 2019, as long as eligibility criteria continues to be met.    Provided patient with community resource material based on her particular needs.    Patient expressed concerns regarding cost for seeing Dr. Hall Busing.  Referred patient to [facility] and [employer].  Patient refused.  Stated that she wanted to continue to see Dr. Hall Busing because he was writing prescriptions for narcotic.    Referred patient to Pain Clinic because patient stated that she has severe back and neck pain.  Patient refused.  [facility]

## 2018-01-12 NOTE — Progress Notes (Signed)
Medication Management Clinic Visit Note  Patient: JAELIE AGUILERA MRN: [id number] Date of Birth: 1971/06/07 PCP: Albina Billet, MD   Arna Medici 47 y.o. female presents for a medication management visit today with the pharmacist.  There were no vitals taken for this visit.  Patient Information   Past Medical History:  Diagnosis Date  . Anxiety   . Fibromyalgia   . Migraine   . Panic disorder       Past Surgical History:  Procedure Laterality Date  . CESAREAN SECTION       Family History  Problem Relation Age of Onset  . Cancer Maternal Uncle        colon  . Diabetes Maternal Grandfather    Outpatient Encounter Medications as of 01/12/2018  Medication Sig  . ALPRAZolam (XANAX XR) 2 MG 24 hr tablet Take 2 mg by mouth 3 (three) times daily.   . butalbital-acetaminophen-caffeine (FIORICET, ESGIC) 50-325-40 MG tablet Take 1 tablet by mouth every 6 (six) hours as needed for headache.   . furosemide (LASIX) 20 MG tablet Take 20 mg by mouth daily as needed for fluid.  Marland Kitchen morphine (MSIR) 30 MG tablet Take 30 mg by mouth daily.   . pregabalin (LYRICA) 150 MG capsule Take 150 mg by mouth 2 (two) times daily.  Marland Kitchen venlafaxine XR (EFFEXOR-XR) 75 MG 24 hr capsule Take 75 mg by mouth daily with breakfast.  . [DISCONTINUED] DULoxetine (CYMBALTA) 60 MG capsule Take 60 mg by mouth daily.   No facility-administered encounter medications on file as of 01/12/2018.      Family Support: Poor and Comments:Patient seems to have poor support system at home. She states that she is responsible for everyone else but that no one helps take care of her.        Social History   Substance and Sexual Activity  Alcohol Use No      Social History   Tobacco Use  Smoking Status Current Every Day Smoker  . Packs/day: 0.25  . Years: 30.00  . Pack years: 7.50  . Types: Cigarettes  Smokeless Tobacco Never Used      Health Maintenance  Topic Date Due  . HIV Screening  11/05/1984  .  TETANUS/TDAP  11/05/1988  . PAP SMEAR  11/05/1990  . INFLUENZA VACCINE  07/26/2017     Assessment and Plan:  Ms. Vega is a 47 yo female who presents today for an MTM and eligibility appointment. During our appointment, she was very anxious and so I primarily focused on her medications. Ms. Stenner was very nervous and spoke about her difficult history including the death of her previous husbands and her past professions. The patient was very difficult to redirect in our conversation and stated that she has had chronic memory issues. Patient is very involved in her daughters care and keeps extensive records of both their medical histories.   Anxiety/Depression: alprazolam, venlafaxine. As stated previously, patient was very anxious today and even took a Xanax during out visit. I assured Ms. Kyser that we were just here to talk at this visit and that there was nothing to be anxious about,. She was anxious that I would call to have her admitted to the hospital because this had been done before at "other places." I stated that I was not going to call and have her held at the hospital for suicidal ideation. She seemed relieved after I said this.   Migraines: Fioricet. Patient states that she periodically has migraines but  that the Fioricet works for her.   Chronic Pain/Fibromyalgia: morphine, pregabalin. She states that she is in chronic pain, but that she is working to lower the amount of morphine that she is taking daily. She states that some days are worse than others for her pain. She also states that she wishes she did not need any of these medications.   Patient also periodically takes Lasix for fluid accumulation.   Lendon Ka, PharmD Pharmacy Resident

## 2018-09-25 ENCOUNTER — Emergency Department: Payer: Medicare HMO

## 2018-09-25 ENCOUNTER — Emergency Department
Admission: EM | Admit: 2018-09-25 | Discharge: 2018-09-25 | Disposition: A | Discharge status: Home / Self Care | Payer: Medicare HMO | Attending: Emergency Medicine | Admitting: Emergency Medicine

## 2018-09-25 ENCOUNTER — Encounter: Payer: Self-pay | Admitting: Emergency Medicine

## 2018-09-25 ENCOUNTER — Other Ambulatory Visit: Payer: Self-pay

## 2018-09-25 DIAGNOSIS — W228XXA Striking against or struck by other objects, initial encounter: Secondary | ICD-10-CM | POA: Diagnosis not present

## 2018-09-25 DIAGNOSIS — F17210 Nicotine dependence, cigarettes, uncomplicated: Secondary | ICD-10-CM | POA: Insufficient documentation

## 2018-09-25 DIAGNOSIS — S0990XA Unspecified injury of head, initial encounter: Secondary | ICD-10-CM | POA: Diagnosis present

## 2018-09-25 DIAGNOSIS — S060X0A Concussion without loss of consciousness, initial encounter: Secondary | ICD-10-CM

## 2018-09-25 DIAGNOSIS — Y9389 Activity, other specified: Secondary | ICD-10-CM | POA: Diagnosis not present

## 2018-09-25 DIAGNOSIS — Y929 Unspecified place or not applicable: Secondary | ICD-10-CM | POA: Insufficient documentation

## 2018-09-25 DIAGNOSIS — Y999 Unspecified external cause status: Secondary | ICD-10-CM | POA: Diagnosis not present

## 2018-09-25 DIAGNOSIS — Z79899 Other long term (current) drug therapy: Secondary | ICD-10-CM | POA: Diagnosis not present

## 2018-09-25 MED ORDER — PROCHLORPERAZINE MALEATE 10 MG PO TABS
10.0000 mg | ORAL_TABLET | Freq: Once | ORAL | Status: AC
  Administered 2018-09-25: 10 mg via ORAL
  Filled 2018-09-25: qty 1

## 2018-09-25 MED ORDER — DIPHENHYDRAMINE HCL 25 MG PO CAPS
25.0000 mg | ORAL_CAPSULE | Freq: Once | ORAL | Status: AC
  Administered 2018-09-25: 25 mg via ORAL
  Filled 2018-09-25: qty 1

## 2018-09-25 MED ORDER — ACETAMINOPHEN 500 MG PO TABS
1000.0000 mg | ORAL_TABLET | Freq: Once | ORAL | Status: AC
  Administered 2018-09-25: 1000 mg via ORAL
  Filled 2018-09-25: qty 2

## 2018-09-25 NOTE — ED Notes (Signed)
Spoke with MD Quentin Cornwall, see verbal orders.

## 2018-09-25 NOTE — ED Provider Notes (Signed)
[facility] Emergency Department Provider Note ____________________________________________   First MD Initiated Contact with Patient 09/25/18 2054     (approximate)  I have reviewed the triage vital signs and the nursing notes.   HISTORY  Chief Complaint Headache   HPI Felicia [name] is a 47 y.o. female of anxiety, fibromyalgia and migraine who was presented emergency department with intermittent headaches over the past 4 days.  Says that she was riding a tractor this past Saturday that was bouncing frequently when she hit her head on the ceiling of the enclosure of the tractor.  She does not report losing consciousness.  Says that she has been having intermittent headaches since.  Says that her headache is a 5 out of 10 right now and feels like a lightening bolt from the front of her head to her back of her head on the left side of her head mostly.  Says that she has had butalbital at home which has been helping intermittently.  Says that it also helps for her to sleep but then when she gets up to walk around the pain resumes.  Denies any pain in her neck.  Says that it is associated with nausea as well as photophobia.  Says that she has had migraines in the past with similar characteristics and was at urgent care earlier today and sent to the emergency department for further evaluation.  Given p.o. Compazine as well as Benadryl and Tylenol with only minimal relief at approximately 6 PM this evening.  Does not report any numbness or tingling.  Denies any focal weakness.   Past Medical History:  Diagnosis Date  . Anxiety   . Fibromyalgia   . Migraine   . Panic disorder     Patient Active Problem List   Diagnosis Date Noted  . Dysthymia 05/13/2016  . Adjustment disorder 05/13/2016  . Sedative dependence ([place]) 05/13/2016  . Suicidal ideation 05/13/2016  . Fibromyalgia 05/13/2016    Past Surgical History:  Procedure Laterality Date  . CESAREAN SECTION       Prior to Admission medications   Medication Sig Start Date End Date Taking? Authorizing Provider  ALPRAZolam (XANAX XR) 2 MG 24 hr tablet Take 2 mg by mouth 3 (three) times daily.     [provider]  butalbital-acetaminophen-caffeine (FIORICET, ESGIC) 50-325-40 MG tablet Take 1 tablet by mouth every 6 (six) hours as needed for headache.     [provider]  furosemide (LASIX) 20 MG tablet Take 20 mg by mouth daily as needed for fluid.    [provider]  morphine (MSIR) 30 MG tablet Take 30 mg by mouth daily.     [provider]  pregabalin (LYRICA) 150 MG capsule Take 150 mg by mouth 2 (two) times daily.    [provider]  venlafaxine XR (EFFEXOR-XR) 75 MG 24 hr capsule Take 75 mg by mouth daily with breakfast.    [provider]    Allergies Patient has no known allergies.  Family History  Problem Relation Age of Onset  . Cancer Maternal Uncle        colon  . Diabetes Maternal Grandfather     Social History Social History   Tobacco Use  . Smoking status: Current Every Day Smoker    Packs/day: 0.25    Years: 30.00    Pack years: 7.50    Types: Cigarettes  . Smokeless tobacco: Never Used  Substance Use Topics  . Alcohol use: No  .  Drug use: No    Review of Systems  Constitutional: No fever/chills Eyes: No visual changes. ENT: No sore throat. Cardiovascular: Denies chest pain. Respiratory: Denies shortness of breath. Gastrointestinal: No abdominal pain.  No nausea, no vomiting.  No diarrhea.  No constipation. Genitourinary: Negative for dysuria. Musculoskeletal: Negative for back pain. Skin: Negative for rash. Neurological: Negative for focal weakness or numbness.   ____________________________________________   PHYSICAL EXAM:  VITAL SIGNS: ED Triage Vitals [09/25/18 1804]  Enc Vitals Group     BP 124/88     Pulse      Resp 16     Temp 98.7 F (37.1 C)     Temp Source Oral     SpO2 96 %      Weight 128 lb (58.1 kg)     Height 5\' 5"  (1.651 m)     Head Circumference      Peak Flow      Pain Score 10     Pain Loc      Pain Edu?      Excl. in [place]?     Constitutional: Alert and oriented.  Patient holding hands over her eyes to block the light. Eyes: Conjunctivae are normal.  Pupils are PERRL.  No nystagmus. Head: Atraumatic. Nose: No congestion/rhinnorhea. Mouth/Throat: Mucous membranes are moist.  Neck: No stridor.  Tenderness over the left trapezius muscle which she says feels improved with palpation. Cardiovascular: Normal rate, regular rhythm. Grossly normal heart sounds.   Respiratory: Normal respiratory effort.  No retractions. Lungs CTAB. Gastrointestinal: Soft and nontender. No distention.  Musculoskeletal: No lower extremity tenderness nor edema.  No joint effusions. Neurologic:  Normal speech and language. No gross focal neurologic deficits are appreciated. Skin:  Skin is warm, dry and intact. No rash noted. Psychiatric: Mood and affect are normal. Speech and behavior are normal.  ____________________________________________   LABS (all labs ordered are listed, but only abnormal results are displayed)  Labs Reviewed - No data to display ____________________________________________  EKG   ____________________________________________  RADIOLOGY  CT head without acute pathology. ____________________________________________   PROCEDURES  Procedure(s) performed:   Procedures  Critical Care performed:   ____________________________________________   INITIAL IMPRESSION / ASSESSMENT AND PLAN / ED COURSE  Pertinent labs & imaging results that were available during my care of the patient were reviewed by me and considered in my medical decision making (see chart for details).  Differential diagnosis includes, but is not limited to, intracranial hemorrhage, meningitis/encephalitis, previous head trauma, cavernous venous thrombosis, tension headache,  temporal arteritis, migraine or migraine equivalent, idiopathic intracranial hypertension, and non-specific headache. As part of my medical decision making, I reviewed the following data within the electronic MEDICAL RECORD NUMBER Notes from prior ED visits  ----------------------------------------- 10:17 PM on 09/25/2018 -----------------------------------------  Patient updated regarding her head CT which is reassuring.  Continues to be neurologically intact without any focal findings.  Offered IV medications but she says that she is in constant pain from her fibromyalgia and was referred here by urgent care for CT and would just like her CT results.  Multiple days of symptoms without neurologic deficits.  No seizure-like episodes.  Unlikely to be aneurysmal issue.  Patient says that she has a refill of Fioricet waiting at the pharmacy for her.  Patient no longer holding her hand over her eyes.  Appears well.  Will be discharged home at this time. ____________________________________________   FINAL CLINICAL IMPRESSION(S) / ED DIAGNOSES  Concussion.    NEW MEDICATIONS STARTED  DURING THIS VISIT:  New Prescriptions   No medications on file     Note:  This document was prepared using Dragon voice recognition software and may include unintentional dictation errors.     Orbie Pyo, MD 09/25/18 2218

## 2018-09-25 NOTE — ED Triage Notes (Signed)
PT from [facility], c/o headache xfew days. Sensitivity to light and sounds, hx of migraines. VSS, ambulatory

## 2018-09-25 NOTE — ED Notes (Signed)
Pharm sending up PO compazine

## 2018-09-25 NOTE — ED Notes (Signed)
Markus Jarvis (Daughter) [phone]

## 2018-10-25 ENCOUNTER — Ambulatory Visit: Payer: Medicare HMO | Admitting: Podiatry

## 2018-10-25 ENCOUNTER — Encounter: Payer: Self-pay | Admitting: Podiatry

## 2018-10-25 VITALS — BP 101/67 | HR 82

## 2018-10-25 DIAGNOSIS — L600 Ingrowing nail: Secondary | ICD-10-CM

## 2018-10-25 MED ORDER — NEOMYCIN-POLYMYXIN-HC 3.5-10000-1 OT SOLN: OTIC | 0 refills | Status: DC

## 2018-10-25 NOTE — Patient Instructions (Signed)

## 2018-10-25 NOTE — Progress Notes (Signed)
Subjective:   Patient ID: Felicia [name], female   DOB: 47 y.o.   MRN: [id number]   HPI Patient presents with painful ingrown toenail right big toe stating she is been doing pedicurist has not been able to fix it and is been going on now for several months.  She is tried to trim and soak it without relief and patient does not smoke and likes to be active   Review of Systems  All other systems reviewed and are negative.       Objective:  Physical Exam  Constitutional: She appears well-developed and well-nourished.  Cardiovascular: Intact distal pulses.  Pulmonary/Chest: Effort normal.  Musculoskeletal: Normal range of motion.  Neurological: She is alert.  Skin: Skin is warm.  Nursing note and vitals reviewed.   Neurovascular status found to be intact muscle strength is adequate range of motion within normal limits with patient right hallux medial border found to be incurvated and painful when palpated.  There is no redness or drainage noted and it was deep into the nailbed that is quite sore and irritated.  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  I explained procedure and risk and patient wants surgery and today I infiltrated the right hallux 60 mg like Marcaine mixture remove the medial border exposed matrix and applied phenol 3 applications 30 seconds after sterile prep of the area and using sterile instrumentation.  I applied sterile dressing gave instructions for soaks and encouraged her to leave the dressing on for 24 hours but to take it off earlier if any throbbing should occur.  Patient was given a prescription for Cortisporin otic solution and encouraged to call with any questions concerns she may have

## 2018-10-31 ENCOUNTER — Emergency Department: Payer: Medicare HMO

## 2018-10-31 ENCOUNTER — Encounter: Payer: Self-pay | Admitting: Emergency Medicine

## 2018-10-31 ENCOUNTER — Other Ambulatory Visit: Payer: Self-pay

## 2018-10-31 ENCOUNTER — Emergency Department
Admission: EM | Admit: 2018-10-31 | Discharge: 2018-10-31 | Disposition: A | Discharge status: Home / Self Care | Payer: Medicare HMO | Attending: Student in an Organized Health Care Education/Training Program | Admitting: Student in an Organized Health Care Education/Training Program

## 2018-10-31 DIAGNOSIS — R05 Cough: Secondary | ICD-10-CM

## 2018-10-31 DIAGNOSIS — R059 Cough, unspecified: Secondary | ICD-10-CM

## 2018-10-31 DIAGNOSIS — Z79899 Other long term (current) drug therapy: Secondary | ICD-10-CM | POA: Diagnosis not present

## 2018-10-31 DIAGNOSIS — K1370 Unspecified lesions of oral mucosa: Secondary | ICD-10-CM | POA: Diagnosis present

## 2018-10-31 DIAGNOSIS — F17210 Nicotine dependence, cigarettes, uncomplicated: Secondary | ICD-10-CM | POA: Diagnosis not present

## 2018-10-31 DIAGNOSIS — B085 Enteroviral vesicular pharyngitis: Secondary | ICD-10-CM | POA: Diagnosis not present

## 2018-10-31 LAB — COMPREHENSIVE METABOLIC PANEL
ALBUMIN: 3.8 g/dL (ref 3.5–5.0)
ALT: 18 U/L (ref 0–44)
AST: 22 U/L (ref 15–41)
Alkaline Phosphatase: 60 U/L (ref 38–126)
Anion gap: 9 (ref 5–15)
BUN: 17 mg/dL (ref 6–20)
CHLORIDE: 100 mmol/L (ref 98–111)
CO2: 27 mmol/L (ref 22–32)
CREATININE: 0.53 mg/dL (ref 0.44–1.00)
Calcium: 8.8 mg/dL — ABNORMAL LOW (ref 8.9–10.3)
GFR calc Af Amer: >60 mL/min (ref >60)
GFR calc non Af Amer: >60 mL/min (ref >60)
GLUCOSE: 128 mg/dL — AB (ref 70–99)
Potassium: 3.8 mmol/L (ref 3.5–5.1)
SODIUM: 136 mmol/L (ref 135–145)
Total Bilirubin: 0.6 mg/dL (ref 0.3–1.2)
Total Protein: 7 g/dL (ref 6.5–8.1)

## 2018-10-31 LAB — URINALYSIS, COMPLETE (UACMP) WITH MICROSCOPIC
Bacteria, UA: NONE SEEN
Bilirubin Urine: NEGATIVE
GLUCOSE, UA: NEGATIVE mg/dL
Hgb urine dipstick: NEGATIVE
Ketones, ur: NEGATIVE mg/dL
Leukocytes, UA: NEGATIVE
Nitrite: NEGATIVE
Protein, ur: NEGATIVE mg/dL
Specific Gravity, Urine: 1.008 (ref 1.005–1.030)
WBC UA: NONE SEEN WBC/hpf (ref 0–5)
pH: 5 (ref 5.0–8.0)

## 2018-10-31 LAB — CBC
HEMATOCRIT: 37.4 % (ref 36.0–46.0)
Hemoglobin: 12.4 g/dL (ref 12.0–15.0)
MCH: 29.4 pg (ref 26.0–34.0)
MCHC: 33.2 g/dL (ref 30.0–36.0)
MCV: 88.6 fL (ref 80.0–100.0)
Platelets: 254 10*3/uL (ref 150–400)
RBC: 4.22 MIL/uL (ref 3.87–5.11)
RDW: 14.5 % (ref 11.5–15.5)
WBC: 8.2 10*3/uL (ref 4.0–10.5)
nRBC: 0 % (ref 0.0–0.2)

## 2018-10-31 LAB — TROPONIN I: Troponin I: <0.03 ng/mL (ref <0.03)

## 2018-10-31 LAB — LIPASE, BLOOD: Lipase: 48 U/L (ref 11–51)

## 2018-10-31 MED ORDER — LIDOCAINE VISCOUS HCL 2 % MT SOLN: 15.0000 mL | Freq: Four times a day (QID) | OROMUCOSAL | 1 refills | Status: DC | PRN

## 2018-10-31 MED ORDER — LIDOCAINE VISCOUS HCL 2 % MT SOLN: 15.0000 mL | Freq: Four times a day (QID) | OROMUCOSAL | 0 refills | Status: DC | PRN

## 2018-10-31 MED ORDER — LIDOCAINE VISCOUS HCL 2 % MT SOLN
15.0000 mL | Freq: Once | OROMUCOSAL | Status: AC
  Administered 2018-10-31: 15 mL via OROMUCOSAL
  Filled 2018-10-31: qty 15

## 2018-10-31 NOTE — ED Notes (Addendum)
Pt has "bumps on her tongue" and is afraid she has been "misdiagnosed" after going through 2 rounds of antibiotics for bronchitis. Also concerned that she may be contagious to others (several family members have been diagnosed with flu and strep in the last few days).

## 2018-10-31 NOTE — ED Notes (Signed)
Pt retching in room; offered to see about getting her some nausea medication. She states that she has medication but is waiting to see if she is going to "get the cocktail" before taking it. She stated that she is looking for an answer to the question of whether or not she should take her home meds for her migraine (which is not why she came in) and that no one will give her an answer. This nurse told her that she should not take any medications until the ED doctor has examined her. Pt verbalized understanding.

## 2018-10-31 NOTE — ED Provider Notes (Signed)
[facility] Emergency Department Provider Note    First MD Initiated Contact with Patient 10/31/18 1832     (approximate)  I have reviewed the triage vital signs and the nursing notes.   HISTORY  Chief Complaint No chief complaint on file.    HPI Felicia [name] is a 47 y.o. female with a history of anxiety and fibromyalgia as well as migraine recently treated for bronchitis with 2 rounds of antibiotics steroids and albuterol inhalers presents the ER with chief complaint of "bumps on her tongue "as well as pain in the back of her mouth.  Patient states she was also having a headache.  States she has a history of recurrent migraines and this is consistent with previous migraines.  Denies any fevers at home.  States that she has having pain associated with coughing.  States that she does smoke and vape.    Past Medical History:  Diagnosis Date  . Anxiety   . Fibromyalgia   . Migraine   . Panic disorder    Family History  Problem Relation Age of Onset  . Cancer Maternal Uncle        colon  . Diabetes Maternal Grandfather    Past Surgical History:  Procedure Laterality Date  . CESAREAN SECTION     Patient Active Problem List   Diagnosis Date Noted  . Dysthymia 05/13/2016  . Adjustment disorder 05/13/2016  . 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 Suicidal ideation 05/13/2016  . Fibromyalgia 05/13/2016      Prior to Admission medications   Medication Sig Start Date End Date Taking? Authorizing Provider  albuterol (PROVENTIL HFA;VENTOLIN HFA) 108 (90 Base) MCG/ACT inhaler Inhale into the lungs. 10/23/18   [provider]  ALPRAZolam (XANAX XR) 2 MG 24 hr tablet Take 2 mg by mouth 3 (three) times daily.     [provider]  azithromycin (ZITHROMAX) 250 MG tablet TAKE 2 TABLETS BY MOUTH ON DAY 1 AND THEN TAKE 1 TABLET BY MOUTH ONCE A DAY ON DAY 2 THROUGH DAY 5 10/17/18   [provider]  butalbital-acetaminophen-caffeine  (FIORICET, ESGIC) 50-325-40 MG tablet Take 1 tablet by mouth every 6 (six) hours as needed for headache.     [provider]  furosemide (LASIX) 20 MG tablet Take 20 mg by mouth daily as needed for fluid.    [provider]  lidocaine (XYLOCAINE) 2 % solution Use as directed 15 mLs in the mouth or throat every 6 (six) hours as needed for mouth pain. 10/31/18   Merlyn Lot, MD  morphine (MSIR) 30 MG tablet Take 30 mg by mouth daily.     [provider]  neomycin-polymyxin-hydrocortisone (CORTISPORIN) OTIC solution Apply 1-2 drops to the toe after soaking toe twice a day 10/25/18   Wallene Huh, DPM  predniSONE (DELTASONE) 10 MG tablet 6 PO Q D X 1 DAY, THEN 5 PO Q D X 1 DAY, THEN 4 PO Q D X 1 DAY, THEN 3 PO Q D X 1 DAY, THEN 2 PO Q D X 1 DAY, THEN 1 PO Q D X 1 DAY 10/23/18   [provider]  pregabalin (LYRICA) 150 MG capsule Take 150 mg by mouth 2 (two) times daily.    [provider]  venlafaxine XR (EFFEXOR-XR) 75 MG 24 hr capsule Take 75 mg by mouth daily with breakfast.    [provider]    Allergies Patient has no known allergies.    Social  History Social History   Tobacco Use  . Smoking status: Current Every Day Smoker    Packs/day: 0.25    Years: 30.00    Pack years: 7.50    Types: Cigarettes  . Smokeless tobacco: Never Used  Substance Use Topics  . Alcohol use: No  . Drug use: No    Review of Systems Patient denies headaches, rhinorrhea, blurry vision, numbness, shortness of breath, chest pain, edema, cough, abdominal pain, nausea, vomiting, diarrhea, dysuria, fevers, rashes or hallucinations unless otherwise stated above in HPI. ____________________________________________   PHYSICAL EXAM:  VITAL SIGNS: Vitals:   10/31/18 1643  BP: 120/78  Pulse: 92  Resp: 16  Temp: 98.4 F (36.9 C)  SpO2: 100%    Constitutional: Alert and oriented, anxious appearing but in no acute distress.  Eyes: Conjunctivae  are normal.  Head: Atraumatic. Nose: No congestion/rhinnorhea. Mouth/Throat: Mucous membranes are moist.  Herpangina appearing lesions in the posterior oropharynx, no exudates, tonsils are nml bilaterally, no uvular edema Neck: No stridor. Painless ROM.  Cardiovascular: Normal rate, regular rhythm. Grossly normal heart sounds.  Good peripheral circulation. Respiratory: Normal respiratory effort.  No retractions. Lungs CTAB. Gastrointestinal: Soft and nontender. No distention. No abdominal bruits. No CVA tenderness. Genitourinary:  Musculoskeletal: No lower extremity tenderness nor edema.  No joint effusions. Neurologic:  Normal speech and language. No gross focal neurologic deficits are appreciated. No facial droop Skin:  Skin is warm, dry and intact. No rash noted. Psychiatric: Mood and affect are normal. Speech and behavior are normal.  ____________________________________________   LABS (all labs ordered are listed, but only abnormal results are displayed)  Results for orders placed or performed during the hospital encounter of 10/31/18 (from the past 24 hour(s))  Urinalysis, Complete w Microscopic     Status: Abnormal   Collection Time: 10/31/18  4:52 PM  Result Value Ref Range   Color, Urine COLORLESS (A) YELLOW   APPearance CLEAR (A) CLEAR   Specific Gravity, Urine 1.008 1.005 - 1.030   pH 5.0 5.0 - 8.0   Glucose, UA NEGATIVE NEGATIVE mg/dL   Hgb urine dipstick NEGATIVE NEGATIVE   Bilirubin Urine NEGATIVE NEGATIVE   Ketones, ur NEGATIVE NEGATIVE mg/dL   Protein, ur NEGATIVE NEGATIVE mg/dL   Nitrite NEGATIVE NEGATIVE   Leukocytes, UA NEGATIVE NEGATIVE   RBC / HPF 0-5 0 - 5 RBC/hpf   WBC, UA NONE SEEN 0 - 5 WBC/hpf   Bacteria, UA NONE SEEN NONE SEEN   Squamous Epithelial / LPF 0-5 0 - 5   Mucus PRESENT   CBC     Status: None   Collection Time: 10/31/18  5:45 PM  Result Value Ref Range   WBC 8.2 4.0 - 10.5 K/uL   RBC 4.22 3.87 - 5.11 MIL/uL   Hemoglobin 12.4 12.0 - 15.0  g/dL   HCT 37.4 36.0 - 46.0 %   MCV 88.6 80.0 - 100.0 fL   MCH 29.4 26.0 - 34.0 pg   MCHC 33.2 30.0 - 36.0 g/dL   RDW 14.5 11.5 - 15.5 %   Platelets 254 150 - 400 K/uL   nRBC 0.0 0.0 - 0.2 %  Comprehensive metabolic panel     Status: Abnormal   Collection Time: 10/31/18  5:45 PM  Result Value Ref Range   Sodium 136 135 - 145 mmol/L   Potassium 3.8 3.5 - 5.1 mmol/L   Chloride 100 98 - 111 mmol/L   CO2 27 22 - 32 mmol/L   Glucose, Bld 128 (H)  70 - 99 mg/dL   BUN 17 6 - 20 mg/dL   Creatinine, Ser 0.53 0.44 - 1.00 mg/dL   Calcium 8.8 (L) 8.9 - 10.3 mg/dL   Total Protein 7.0 6.5 - 8.1 g/dL   Albumin 3.8 3.5 - 5.0 g/dL   AST 22 15 - 41 U/L   ALT 18 0 - 44 U/L   Alkaline Phosphatase 60 38 - 126 U/L   Total Bilirubin 0.6 0.3 - 1.2 mg/dL   GFR calc non Af Amer >60 >60 mL/min   GFR calc Af Amer >60 >60 mL/min   Anion gap 9 5 - 15  Lipase, blood     Status: None   Collection Time: 10/31/18  5:45 PM  Result Value Ref Range   Lipase 48 11 - 51 U/L  Troponin I     Status: None   Collection Time: 10/31/18  5:45 PM  Result Value Ref Range   Troponin I <0.03 <0.03 ng/mL   ____________________________________________  EKG My review and personal interpretation at Time: 19:19   Indication: sob  Rate: 60 Rhythm: sinus Axis: normal Other: normal intervals, no stemi ____________________________________________  RADIOLOGY  I personally reviewed all radiographic images ordered to evaluate for the above acute complaints and reviewed radiology reports and findings.  These findings were personally discussed with the patient.  Please see medical record for radiology report.  ____________________________________________   PROCEDURES  Procedure(s) performed:  Procedures    Critical Care performed: no ____________________________________________   INITIAL IMPRESSION / ASSESSMENT AND PLAN / ED COURSE  Pertinent labs & imaging results that were available during my care of the patient  were reviewed by me and considered in my medical decision making (see chart for details).   DDX: Herpangina, bronchitis, COPD, anemia, ACS, congestive heart failure, PE, pneumonia  LAILYNN SOUTHGATE is a 47 y.o. who presents to the ED with symptoms as described above.  Patient nontoxic-appearing afebrile Heema dynamically stable.  She is low risk by Wells criteria and is PERC negative.  No wheezing on exam.  No respiratory distress.  Does seem mildly anxious.  Blood work will be sent for the above differential.  Her bumps on her tongue seem most clinically consistent with herpangina.  Will be given viscous lidocaine.  Clinical Course as of Nov 01 1943  Wed Oct 31, 2018  1924 Is reassuring.  EKG without evidence of acute ischemia.  This point I do believe patient stable and appropriate for outpatient follow-up.   [PR]    Clinical Course User Index [PR] Merlyn Lot, MD     As part of my medical decision making, I reviewed the following data within the [place] notes reviewed and incorporated, Labs reviewed, notes from prior ED visits and [place] Controlled Substance Database   ____________________________________________   FINAL CLINICAL IMPRESSION(S) / ED DIAGNOSES  Final diagnoses:  Herpangina  Cough      NEW MEDICATIONS STARTED DURING THIS VISIT:  Current Discharge Medication List    START taking these medications   Details  lidocaine (XYLOCAINE) 2 % solution Use as directed 15 mLs in the mouth or throat every 6 (six) hours as needed for mouth pain. Qty: 150 mL, Refills: 1         Note:  This document was prepared using Dragon voice recognition software and may include unintentional dictation errors.    Merlyn Lot, MD 10/31/18 1944

## 2018-10-31 NOTE — ED Triage Notes (Signed)
Says bronchitis and has tanke z pack and then amoxicillin/pred.  This is third week. She says she has family membes with strep and  Is worried about that.  Went to doctor last night and they said she still had bronchitis and gave inhaler and wanted to give prednisone again.  Says now her tongue has rash on it.  Diarrhea many times.,  Vomiting since she was at dr last night.

## 2018-10-31 NOTE — Discharge Instructions (Addendum)
Please be sure to use your inhaler 2 puffs every 4-6 hours while awake.  I have given you referral to pulmonologist.  Please return if you have any worsening symptoms or any additional questions or concerns.

## 2018-10-31 NOTE — ED Notes (Signed)
Pt discharged home after verbalizing understanding of discharge instructions; nad noted. 

## 2018-11-18 ENCOUNTER — Other Ambulatory Visit: Payer: Self-pay

## 2018-11-18 ENCOUNTER — Emergency Department
Admission: EM | Admit: 2018-11-18 | Discharge: 2018-11-18 | Disposition: A | Discharge status: Home / Self Care | Payer: Medicare HMO | Attending: Emergency Medicine | Admitting: Emergency Medicine

## 2018-11-18 ENCOUNTER — Emergency Department: Payer: Medicare HMO

## 2018-11-18 DIAGNOSIS — R51 Headache: Secondary | ICD-10-CM | POA: Diagnosis not present

## 2018-11-18 DIAGNOSIS — S20211A Contusion of right front wall of thorax, initial encounter: Secondary | ICD-10-CM | POA: Diagnosis not present

## 2018-11-18 DIAGNOSIS — S299XXA Unspecified injury of thorax, initial encounter: Secondary | ICD-10-CM | POA: Diagnosis present

## 2018-11-18 DIAGNOSIS — R52 Pain, unspecified: Secondary | ICD-10-CM

## 2018-11-18 DIAGNOSIS — F17210 Nicotine dependence, cigarettes, uncomplicated: Secondary | ICD-10-CM | POA: Diagnosis not present

## 2018-11-18 DIAGNOSIS — Y92410 Unspecified street and highway as the place of occurrence of the external cause: Secondary | ICD-10-CM | POA: Diagnosis not present

## 2018-11-18 DIAGNOSIS — Y999 Unspecified external cause status: Secondary | ICD-10-CM | POA: Insufficient documentation

## 2018-11-18 DIAGNOSIS — Z79899 Other long term (current) drug therapy: Secondary | ICD-10-CM | POA: Insufficient documentation

## 2018-11-18 DIAGNOSIS — Y9389 Activity, other specified: Secondary | ICD-10-CM | POA: Diagnosis not present

## 2018-11-18 DIAGNOSIS — S161XXA Strain of muscle, fascia and tendon at neck level, initial encounter: Secondary | ICD-10-CM | POA: Insufficient documentation

## 2018-11-18 LAB — POCT PREGNANCY, URINE: Preg Test, Ur: NEGATIVE

## 2018-11-18 MED ORDER — IBUPROFEN 800 MG PO TABS
800.0000 mg | ORAL_TABLET | Freq: Once | ORAL | Status: AC
  Administered 2018-11-18: 800 mg via ORAL
  Filled 2018-11-18: qty 1
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
## 2018-11-18 NOTE — Discharge Instructions (Addendum)
No serious injury is suspected.  Return to the emergency department immediately for any worsening condition including trouble breathing, chest pain, abdominal pain, weakness or numbness, confusion altered mental status, or any other symptoms concerning to you.  You may try over-the-counter ibuprofen 800 mg every 8 hours as needed for anti-inflammatory/pain control, use as directed on labeling.
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
## 2018-11-18 NOTE — ED Notes (Signed)
Patient transported to X-ray 

## 2018-11-18 NOTE — ED Notes (Signed)
Pt assisted to bathroom

## 2018-11-18 NOTE — ED Notes (Signed)
Return from Xray
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
## 2018-12-10 ENCOUNTER — Telehealth: Payer: Self-pay | Admitting: Pharmacy Technician

## 2018-12-10 NOTE — Telephone Encounter (Signed)
Patient has Medicaid.  No longer meets [facility]'s eligibility criteria.  Patient notified by letter.  [facility]

## 2019-01-14 ENCOUNTER — Encounter: Payer: No Typology Code available for payment source | Admitting: Pharmacist
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
## 2020-06-03 ENCOUNTER — Other Ambulatory Visit: Payer: Self-pay | Admitting: Internal Medicine

## 2020-06-03 DIAGNOSIS — Z1231 Encounter for screening mammogram for malignant neoplasm of breast: Secondary | ICD-10-CM

## 2020-06-12 ENCOUNTER — Ambulatory Visit
Admission: RE | Admit: 2020-06-12 | Discharge: 2020-06-12 | Disposition: A | Discharge status: Home / Self Care | Payer: Medicare HMO | Source: Ambulatory Visit | Attending: Internal Medicine | Admitting: Internal Medicine

## 2020-06-12 DIAGNOSIS — Z1231 Encounter for screening mammogram for malignant neoplasm of breast: Secondary | ICD-10-CM | POA: Diagnosis present

## 2020-07-02 ENCOUNTER — Ambulatory Visit (INDEPENDENT_AMBULATORY_CARE_PROVIDER_SITE_OTHER): Payer: Medicare HMO | Admitting: Obstetrics and Gynecology

## 2020-07-02 ENCOUNTER — Other Ambulatory Visit: Payer: Self-pay

## 2020-07-02 ENCOUNTER — Encounter: Payer: Self-pay | Admitting: Obstetrics and Gynecology

## 2020-07-02 ENCOUNTER — Encounter: Payer: Medicare HMO | Admitting: Obstetrics and Gynecology

## 2020-07-02 ENCOUNTER — Other Ambulatory Visit (HOSPITAL_COMMUNITY)
Admission: RE | Admit: 2020-07-02 | Discharge: 2020-07-02 | Disposition: A | Discharge status: Home / Self Care | Payer: Medicare HMO | Source: Ambulatory Visit | Attending: Obstetrics and Gynecology | Admitting: Obstetrics and Gynecology

## 2020-07-02 VITALS — BP 120/72 | HR 86 | Resp 16 | Ht 67.0 in | Wt 142.0 lb

## 2020-07-02 DIAGNOSIS — N760 Acute vaginitis: Secondary | ICD-10-CM

## 2020-07-02 DIAGNOSIS — Z01419 Encounter for gynecological examination (general) (routine) without abnormal findings: Secondary | ICD-10-CM | POA: Diagnosis present

## 2020-07-02 DIAGNOSIS — Z1211 Encounter for screening for malignant neoplasm of colon: Secondary | ICD-10-CM

## 2020-07-02 DIAGNOSIS — Z1151 Encounter for screening for human papillomavirus (HPV): Secondary | ICD-10-CM | POA: Diagnosis not present

## 2020-07-02 DIAGNOSIS — Z0000 Encounter for general adult medical examination without abnormal findings: Secondary | ICD-10-CM

## 2020-07-02 DIAGNOSIS — Z30432 Encounter for removal of intrauterine contraceptive device: Secondary | ICD-10-CM

## 2020-07-02 DIAGNOSIS — Z124 Encounter for screening for malignant neoplasm of cervix: Secondary | ICD-10-CM

## 2020-07-02 DIAGNOSIS — Z1231 Encounter for screening mammogram for malignant neoplasm of breast: Secondary | ICD-10-CM

## 2020-07-02 NOTE — Patient Instructions (Signed)
[employer] Recommended Dietary Allowances for Calcium and Vitamin D  Age (yr) Calcium Recommended Dietary Allowance (mg/day) Vitamin D Recommended Dietary Allowance (international units/day)  9-18 1,300 600  19-50 1,000 600  51-70 1,200 600  71 and older 1,200 800  Data from [employer]. Dietary reference intakes: calcium, vitamin D. [place], [place]: National Academies [employer]; 2011.     Exercising to Stay Healthy To become healthy and stay healthy, it is recommended that you do moderate-intensity and vigorous-intensity exercise. You can tell that you are exercising at a moderate intensity if your heart starts beating faster and you start breathing faster but can still hold a conversation. You can tell that you are exercising at a vigorous intensity if you are breathing much harder and faster and cannot hold a conversation while exercising. Exercising regularly is important. It has many health benefits, such as:  Improving overall fitness, flexibility, and endurance.  Increasing bone density.  Helping with weight control.  Decreasing body fat.  Increasing muscle strength.  Reducing stress and tension.  Improving overall health. How often should I exercise? Choose an activity that you enjoy, and set realistic goals. Your health care provider can help you make an activity plan that works for you. Exercise regularly as told by your health care provider. This may include:  Doing strength training two times a week, such as: ? Lifting weights. ? Using resistance bands. ? Push-ups. ? Sit-ups. ? Yoga.  Doing a certain intensity of exercise for a given amount of time. Choose from these options: ? A total of 150 minutes of moderate-intensity exercise every week. ? A total of 75 minutes of vigorous-intensity exercise every week. ? A mix of moderate-intensity and vigorous-intensity exercise every week. Children, pregnant women, people who have not exercised  regularly, people who are overweight, and older adults may need to talk with a health care provider about what activities are safe to do. If you have a medical condition, be sure to talk with your health care provider before you start a new exercise program. What are some exercise ideas? Moderate-intensity exercise ideas include:  Walking 1 mile (1.6 km) in about 15 minutes.  Biking.  Hiking.  Golfing.  Dancing.  Water aerobics. Vigorous-intensity exercise ideas include:  Walking 4.5 miles (7.2 km) or more in about 1 hour.  Jogging or running 5 miles (8 km) in about 1 hour.  Biking 10 miles (16.1 km) or more in about 1 hour.  Lap swimming.  Roller-skating or in-line skating.  Cross-country skiing.  Vigorous competitive sports, such as football, basketball, and soccer.  Jumping rope.  Aerobic dancing. What are some everyday activities that can help me to get exercise?  Yard work, such as: ? Pushing a lawn mower. ? Raking and bagging leaves.  Washing your car.  Pushing a stroller.  Shoveling snow.  Gardening.  Washing windows or floors. How can I be more active in my day-to-day activities?  Use stairs instead of an elevator.  Take a walk during your lunch break.  If you drive, park your car farther away from your work or school.  If you take public transportation, get off one stop early and walk the rest of the way.  Stand up or walk around during all of your indoor phone calls.  Get up, stretch, and walk around every 30 minutes throughout the day.  Enjoy exercise with a friend. Support to continue exercising will help you keep a regular routine of activity. What guidelines can   I follow while exercising?  Before you start a new exercise program, talk with your health care provider.  Do not exercise so much that you hurt yourself, feel dizzy, or get very short of breath.  Wear comfortable clothes and wear shoes with good support.  Drink plenty of  water while you exercise to prevent dehydration or heat stroke.  Work out until your breathing and your heartbeat get faster. Where to find more information  [place] Department of Health and Human Services: www.hhs.gov  Centers for Disease Control and Prevention (CDC): www.cdc.gov Summary  Exercising regularly is important. It will improve your overall fitness, flexibility, and endurance.  Regular exercise also will improve your overall health. It can help you control your weight, reduce stress, and improve your bone density.  Do not exercise so much that you hurt yourself, feel dizzy, or get very short of breath.  Before you start a new exercise program, talk with your health care provider. This information is not intended to replace advice given to you by your health care provider. Make sure you discuss any questions you have with your health care provider. Document Revised: 11/24/2017 Document Reviewed: 11/02/2017 Elsevier Patient Education  2020 [employer]   Budget-Friendly Healthy Eating There are many ways to save money at the grocery store and continue to eat healthy. You can be successful if you:  Plan meals according to your budget.  Make a grocery list and only purchase food according to your grocery list.  Prepare food yourself. What are tips for following this plan?  Reading food labels  Compare food labels between brand name foods and the store brand. Often the nutritional value is the same, but the store brand is lower cost.  Look for products that do not have added sugar, fat, or salt (sodium). These often cost the same but are healthier for you. Products may be labeled as: ? Sugar-free. ? Nonfat. ? Low-fat. ? Sodium-free. ? Low-sodium.  Look for lean ground beef labeled as at least 92% lean and 8% fat. Shopping  Buy only the items on your grocery list and go only to the areas of the store that have the items on your list.  Use coupons only for foods  and brands you normally buy. Avoid buying items you wouldn't normally buy simply because they are on sale.  Check online and in newspapers for weekly deals.  Buy healthy items from the bulk bins when available, such as herbs, spices, flour, pasta, nuts, and dried fruit.  Buy fruits and vegetables that are in season. Prices are usually lower on in-season produce.  Look at the unit price on the price tag. Use it to compare different brands and sizes to find out which item is the best deal.  Choose healthy items that are often low-cost, such as carrots, potatoes, apples, bananas, and oranges. Dried or canned beans are a low-cost protein source.  Buy in bulk and freeze extra food. Items you can buy in bulk include meats, fish, poultry, frozen fruits, and frozen vegetables.  Avoid buying "ready-to-eat" foods, such as pre-cut fruits and vegetables and pre-made salads.  If possible, shop around to discover where you can find the best prices. Consider other retailers such as dollar stores, larger wholesale stores, local fruit and vegetable stands, and farmers markets.  Do not shop when you are hungry. If you shop while hungry, it may be hard to stick to your list and budget.  Resist impulse buying. Use your grocery list as   your official plan for the week.  Buy a variety of vegetables and fruits by purchasing fresh, frozen, and canned items.  Look at the top and bottom shelves for deals. Foods at eye level (eye level of an adult or child) are usually more expensive.  Be efficient with your time when shopping. The more time you spend at the store, the more money you are likely to spend.  To save money when choosing more expensive foods like meats and dairy: ? Choose cheaper cuts of meat, such as bone-in chicken thighs and drumsticks instead of skinless and boneless chicken. When you are ready to prepare the chicken, you can remove the skin yourself to make it healthier. ? Choose lean meats like  chicken or turkey instead of beef. ? Choose canned seafood, such as tuna, salmon, or sardines. ? Buy eggs as a low-cost source of protein. ? Buy dried beans and peas, such as lentils, split peas, or kidney beans instead of meats. Dried beans and peas are a good alternative source of protein. ? Buy the larger tubs of yogurt instead of individual-sized containers.  Choose water instead of sodas and other sweetened beverages.  Avoid buying chips, cookies, and other "junk food." These items are usually expensive and not healthy. Cooking  Make extra food and freeze the extras in meal-sized containers or in individual portions for fast meals and snacks.  Pre-cook on days when you have extra time to prepare meals in advance. You can keep these meals in the fridge or freezer and reheat for a quick meal.  When you come home from the grocery store, wash, peel, and cut fruits and vegetables so they are ready to use and eat. This will help reduce food waste. Meal planning  Do not eat out or get fast food. Prepare food at home.  Make a grocery list and make sure to bring it with you to the store. If you have a smart phone, you could use your phone to create your shopping list.  Plan meals and snacks according to a grocery list and budget you create.  Use leftovers in your meal plan for the week.  Look for recipes where you can cook once and make enough food for two meals.  Include budget-friendly meals like stews, casseroles, and stir-fry dishes.  Try some meatless meals or try "no cook" meals like salads.  Make sure that half your plate is filled with fruits or vegetables. Choose from fresh, frozen, or canned fruits and vegetables. If eating canned, remember to rinse them before eating. This will remove any excess salt added for packaging. Summary  Eating healthy on a budget is possible if you plan your meals according to your budget, purchase according to your budget and grocery list, and  prepare food yourself.  Tips for buying more food on a limited budget include buying generic brands, using coupons only for foods you normally buy, and buying healthy items from the bulk bins when available.  Tips for buying cheaper food to replace expensive food include choosing cheaper, lean cuts of meat, and buying dried beans and peas. This information is not intended to replace advice given to you by your health care provider. Make sure you discuss any questions you have with your health care provider. Document Revised: 12/13/2017 Document Reviewed: 12/13/2017 Elsevier Patient Education  2020 [employer].   Bone Health Bones protect organs, store calcium, anchor muscles, and support the whole body. Keeping your bones strong is important, especially as you   get older. You can take actions to help keep your bones strong and healthy. Why is keeping my bones healthy important?  Keeping your bones healthy is important because your body constantly replaces bone cells. Cells get old, and new cells take their place. As we age, we lose bone cells because the body may not be able to make enough new cells to replace the old cells. The amount of bone cells and bone tissue you have is referred to as bone mass. The higher your bone mass, the stronger your bones. The aging process leads to an overall loss of bone mass in the body, which can increase the likelihood of:  Joint pain and stiffness.  Broken bones.  A condition in which the bones become weak and brittle (osteoporosis). A large decline in bone mass occurs in older adults. In women, it occurs about the time of menopause. What actions can I take to keep my bones healthy? Good health habits are important for maintaining healthy bones. This includes eating nutritious foods and exercising regularly. To have healthy bones, you need to get enough of the right minerals and vitamins. Most nutrition experts recommend getting these nutrients from the  foods that you eat. In some cases, taking supplements may also be recommended. Doing certain types of exercise is also important for bone health. What are the nutritional recommendations for healthy bones?  Eating a well-balanced diet with plenty of calcium and vitamin D will help to protect your bones. Nutritional recommendations vary from person to person. Ask your health care provider what is healthy for you. Here are some general guidelines. Get enough calcium Calcium is the most important (essential) mineral for bone health. Most people can get enough calcium from their diet, but supplements may be recommended for people who are at risk for osteoporosis. Good sources of calcium include:  Dairy products, such as low-fat or nonfat milk, cheese, and yogurt.  Dark green leafy vegetables, such as bok choy and broccoli.  Calcium-fortified foods, such as orange juice, cereal, bread, soy beverages, and tofu products.  Nuts, such as almonds. Follow these recommended amounts for daily calcium intake:  Children, age 1-3: 700 mg.  Children, age 4-8: 1,000 mg.  Children, age 9-13: 1,300 mg.  Teens, age 14-18: 1,300 mg.  Adults, age 19-50: 1,000 mg.  Adults, age 51-70: ? Men: 1,000 mg. ? Women: 1,200 mg.  Adults, age 71 or older: 1,200 mg.  Pregnant and breastfeeding females: ? Teens: 1,300 mg. ? Adults: 1,000 mg. Get enough vitamin D Vitamin D is the most essential vitamin for bone health. It helps the body absorb calcium. Sunlight stimulates the skin to make vitamin D, so be sure to get enough sunlight. If you live in a cold climate or you do not get outside often, your health care provider may recommend that you take vitamin D supplements. Good sources of vitamin D in your diet include:  Egg yolks.  Saltwater fish.  Milk and cereal fortified with vitamin D. Follow these recommended amounts for daily vitamin D intake:  Children and teens, age 1-18: 600 international  units.  Adults, age 50 or younger: 400-800 international units.  Adults, age 51 or older: 800-1,000 international units. Get other important nutrients Other nutrients that are important for bone health include:  Phosphorus. This mineral is found in meat, poultry, dairy foods, nuts, and legumes. The recommended daily intake for adult men and adult women is 700 mg.  Magnesium. This mineral is found in seeds, nuts, dark   green vegetables, and legumes. The recommended daily intake for adult men is 400-420 mg. For adult women, it is 310-320 mg.  Vitamin K. This vitamin is found in green leafy vegetables. The recommended daily intake is 120 mg for adult men and 90 mg for adult women. What type of physical activity is best for building and maintaining healthy bones? Weight-bearing and strength-building activities are important for building and maintaining healthy bones. Weight-bearing activities cause muscles and bones to work against gravity. Strength-building activities increase the strength of the muscles that support bones. Weight-bearing and muscle-building activities include:  Walking and hiking.  Jogging and running.  Dancing.  Gym exercises.  Lifting weights.  Tennis and racquetball.  Climbing stairs.  Aerobics. Adults should get at least 30 minutes of moderate physical activity on most days. Children should get at least 60 minutes of moderate physical activity on most days. Ask your health care provider what type of exercise is best for you. How can I find out if my bone mass is low? Bone mass can be measured with an X-ray test called a bone mineral density (BMD) test. This test is recommended for all women who are age 65 or older. It may also be recommended for:  Men who are age 70 or older.  People who are at risk for osteoporosis because of: ? Having bones that break easily. ? Having a long-term disease that weakens bones, such as kidney disease or rheumatoid  arthritis. ? Having menopause earlier than normal. ? Taking medicine that weakens bones, such as steroids, thyroid hormones, or hormone treatment for breast cancer or prostate cancer. ? Smoking. ? Drinking three or more alcoholic drinks a day. If you find that you have a low bone mass, you may be able to prevent osteoporosis or further bone loss by changing your diet and lifestyle. Where can I find more information? For more information, check out the following websites:  National Osteoporosis Foundation: www.nof.org/patients  National Institutes of Health: www.bones.nih.gov  International Osteoporosis Foundation: www.iofbonehealth.org Summary  The aging process leads to an overall loss of bone mass in the body, which can increase the likelihood of broken bones and osteoporosis.  Eating a well-balanced diet with plenty of calcium and vitamin D will help to protect your bones.  Weight-bearing and strength-building activities are also important for building and maintaining strong bones.  Bone mass can be measured with an X-ray test called a bone mineral density (BMD) test. This information is not intended to replace advice given to you by your health care provider. Make sure you discuss any questions you have with your health care provider. Document Revised: 01/08/2018 Document Reviewed: 01/08/2018 Elsevier Patient Education  2020 [employer].   

## 2020-07-02 NOTE — Progress Notes (Signed)
Gynecology Annual Exam  PCP: Albina Billet, MD  Chief Complaint:  Chief Complaint  Patient presents with  . Gynecologic Exam    IUD removal    History of Present Illness: Patient is a 49 y.o. [name] presents for annual exam. The patient has no complaints today.   LMP: No LMP recorded. (Menstrual status: IUD). Reports she has been more than 1 year without a period. Last period may of been 3 years ago.   Patient reports she has had an IUD in for more than 20 years.  She is unsure what type of IUD it is.  She reports that she entered menarche at the age of 21 or 89 although she is uncertain and asked what the average age of menarche was for most people.  She reports that when she was having menstrual cycles she generally had a regular monthly menstrual cycle.  She has no history of fibroids polyps or ovarian cyst.  She denies any history of sexually transmitted infections.    She reports that she her sexual partner and her son were tested for herpes in the past.  She reports that her test was negative but her sexual partners test was positive.  She has been having some issues with a recurrent spot on her buttocks which she wonders if it is herpes.  She reports that it initially looked like a pimple and now it is scabbed over.  She does have some moderate pain with the lesion.  Patient reports that she has been struggling with vaginal discharge and odor.  She reports that she has a thick white discharge.  She reports that in the shower she uses her finger to clean out discharge from her vagina.  When she does that she can feel the strings of the IUD.  She reports that in the past she never had any issues with infections and is uncertain if she has infection at this time.  She has no history of abnormal Pap smears.  It has been a long time since she last had a Pap smear.  She reports that she is no longer sexually active or only sexually active occasionally.  Her GAD and PHQ scores were  elevated at 14 and 14.  However she reports that this is normal for her and she declines any management at this time.   The patient does perform self breast exams.  There is notable family history of breast or ovarian cancer in her family.    Review of Systems: Review of Systems  Constitutional: Negative for chills, fever, malaise/fatigue and weight loss.  HENT: Negative for congestion, hearing loss and sinus pain.   Eyes: Negative for blurred vision and double vision.  Respiratory: Negative for cough, sputum production, shortness of breath and wheezing.   Cardiovascular: Negative for chest pain, palpitations, orthopnea and leg swelling.  Gastrointestinal: Negative for abdominal pain, constipation, diarrhea, nausea and vomiting.  Genitourinary: Negative for dysuria, flank pain, frequency, hematuria and urgency.  Musculoskeletal: Negative for back pain, falls and joint pain.  Skin: Negative for itching and rash.  Neurological: Negative for dizziness and headaches.  Psychiatric/Behavioral: Negative for depression, substance abuse and suicidal ideas. The patient is not nervous/anxious.     Past Medical History:  Patient Active Problem List   Diagnosis Date Noted  . Dysthymia 05/13/2016  . Adjustment disorder 05/13/2016  . 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 Suicidal ideation 05/13/2016  . Fibromyalgia 05/13/2016    Past Surgical History:  Past Surgical  History:  Procedure Laterality Date  . AUGMENTATION MAMMAPLASTY    . CESAREAN SECTION      Gynecologic History:  No LMP recorded. (Menstrual status: IUD). Contraception: IUD Last Pap: Results were: unknown- long time since last pap  Last mammogram: 2021 Results were: BI-RAD I  Obstetric History: [id number]  Family History:  Family History  Problem Relation Age of Onset  . Cancer Maternal Uncle        colon  . Diabetes Maternal Grandfather   . Breast cancer Father     Social History:  Social History   Socioeconomic  History  . Marital status: Married    Spouse name: Not on file  . Number of children: Not on file  . Years of education: Not on file  . Highest education level: Not on file  Occupational History  . Not on file  Tobacco Use  . Smoking status: Current Every Day Smoker    Packs/day: 0.25    Years: 30.00    Pack years: 7.50    Types: Cigarettes  . Smokeless tobacco: Never Used  Vaping Use  . Vaping Use: Some days  Substance and Sexual Activity  . Alcohol use: No  . Drug use: No  . Sexual activity: Yes    Birth control/protection: I.U.D.  Other Topics Concern  . Not on file  Social History Narrative  . Not on file   Social Determinants of Health   Financial Resource Strain:   . Difficulty of Paying Living Expenses:   Food Insecurity:   . Worried About Charity fundraiser in the Last Year:   . Arboriculturist in the Last Year:   Transportation Needs:   . Film/video editor (Medical):   Marland Kitchen Lack of Transportation (Non-Medical):   Physical Activity:   . Days of Exercise per Week:   . Minutes of Exercise per Session:   Stress:   . Feeling of Stress :   Social Connections:   . Frequency of Communication with Friends and Family:   . Frequency of Social Gatherings with Friends and Family:   . Attends Religious Services:   . Active Member of Clubs or Organizations:   . Attends Archivist Meetings:   Marland Kitchen Marital Status:   Intimate Partner Violence:   . Fear of Current or Ex-Partner:   . Emotionally Abused:   Marland Kitchen Physically Abused:   . Sexually Abused:     Allergies:  No Known Allergies  Medications: Prior to Admission medications   Medication Sig Start Date End Date Taking? Authorizing Provider  albuterol (PROVENTIL HFA;VENTOLIN HFA) 108 (90 Base) MCG/ACT inhaler Inhale into the lungs. 10/23/18  Yes [provider]  ALPRAZolam (XANAX XR) 2 MG 24 hr tablet Take 2 mg by mouth 3 (three) times daily.    Yes [provider]    butalbital-acetaminophen-caffeine (FIORICET, ESGIC) 50-325-40 MG tablet Take 1 tablet by mouth every 6 (six) hours as needed for headache.    Yes [provider]  furosemide (LASIX) 20 MG tablet Take 20 mg by mouth daily as needed for fluid.   Yes [provider]  lidocaine (XYLOCAINE) 2 % solution Use as directed 15 mLs in the mouth or throat every 6 (six) hours as needed for mouth pain. 10/31/18  Yes Merlyn Lot, MD  morphine (MSIR) 30 MG tablet Take 30 mg by mouth daily.    Yes [provider]  neomycin-polymyxin-hydrocortisone (CORTISPORIN) OTIC solution Apply 1-2 drops to the toe after soaking  toe twice a day 10/25/18  Yes Regal, Tamala Fothergill, DPM  predniSONE (DELTASONE) 10 MG tablet 6 PO Q D X 1 DAY, THEN 5 PO Q D X 1 DAY, THEN 4 PO Q D X 1 DAY, THEN 3 PO Q D X 1 DAY, THEN 2 PO Q D X 1 DAY, THEN 1 PO Q D X 1 DAY 10/23/18  Yes [provider]  pregabalin (LYRICA) 150 MG capsule Take 150 mg by mouth 2 (two) times daily.   Yes [provider]  venlafaxine XR (EFFEXOR-XR) 75 MG 24 hr capsule Take 75 mg by mouth daily with breakfast.   Yes [provider]    Physical Exam Vitals: Blood pressure 120/72, pulse 86, resp. rate 16, height 5\' 7"  (1.702 m), weight 142 lb (64.4 kg), SpO2 98 %.  General: NAD HEENT: normocephalic, anicteric Thyroid: no enlargement, no palpable nodules Pulmonary: No increased work of breathing, CTAB Cardiovascular: RRR, distal pulses 2+ Breast: Breast symmetrical, no tenderness, no palpable nodules or masses, no skin or nipple retraction present, no nipple discharge.  No axillary or supraclavicular lymphadenopathy. Breast implants in place. Abdomen: NABS, soft, non-tender, non-distended.  Umbilicus without lesions.  No hepatomegaly, splenomegaly or masses palpable. No evidence of hernia  Genitourinary:  External: Normal external female genitalia.  Normal urethral meatus, normal Bartholin's and Skene's glands.     Vagina: Normal vaginal mucosa, no evidence of prolapse.    Cervix: Grossly normal in appearance, no bleeding  Uterus: Non-enlarged, mobile, normal contour.  No CMT  Adnexa: ovaries non-enlarged, no adnexal masses  Rectal: deferred  Lymphatic: no evidence of inguinal lymphadenopathy Extremities: no edema, erythema, or tenderness Neurologic: Grossly intact Psychiatric: mood appropriate, affect full  Female chaperone present for pelvic and breast  portions of the physical exam  Procedure IUD strings grasped with ring forcep and IUD (Paragard) removed intact.    Assessment: 49 y.o. [id number] routine annual exam  Plan: Problem List Items Addressed This Visit    None    Visit Diagnoses    Encounter for annual routine gynecological examination    -  Primary   Health maintenance examination       Breast cancer screening by mammogram       Colon cancer screening       Relevant Orders   Ambulatory referral to Gastroenterology   Cervical cancer screening       Relevant Orders   Cytology - PAP   Acute vaginitis       Relevant Orders   NuSwab Vaginitis Plus (VG+)   Encounter for IUD removal          1) Mammogram - recommend yearly screening mammogram.  Mammogram Is up to date  2) Vaginitis screening  was offered and accepted.  Encouraged the patient to splash the perineum with warm water to wash.  Discouraged the patient from using any soaps or detergents on the vagina.    3) ASCCP guidelines and rational discussed.  Patient opts for every 3 years screening interval  4) Contraception - the patient is currently using  IUD.  She is interested in changing to none  5) Colonoscopy -- Screening recommended starting at age 72 for average risk individuals, age 66 for individuals deemed at increased risk (including African Americans) and recommended to continue until age 63.  For patient age 46-85 individualized approach is recommended.  Gold standard screening is via colonoscopy, Cologuard  screening is an acceptable alternative for patient unwilling or unable to undergo colonoscopy.  "  Colorectal cancer screening for average?risk adults: 2018 guideline update from the American Cancer Society"CA: A Cancer Journal for Clinicians: May 24, 2017   6) Routine healthcare maintenance including cholesterol, diabetes screening discussed managed by PCP  7) Patient offered genetic breast cancer screening because of father's history of female breast cancer. She declines.   8) Return in about 1 year (around 07/02/2021) for annual.   Cathle[facility], [facility] 07/02/2020, 2:47 PM

## 2020-07-06 LAB — NUSWAB VAGINITIS PLUS (VG+)
Candida albicans, NAA: NEGATIVE
Candida glabrata, NAA: NEGATIVE
Chlamydia trachomatis, NAA: NEGATIVE
Neisseria gonorrhoeae, NAA: NEGATIVE
Trich vag by NAA: NEGATIVE

## 2020-07-07 LAB — CYTOLOGY - PAP
Comment: NEGATIVE
Diagnosis: NEGATIVE
High risk HPV: NEGATIVE

## 2020-07-10 ENCOUNTER — Other Ambulatory Visit: Payer: Self-pay

## 2020-07-10 DIAGNOSIS — Z1211 Encounter for screening for malignant neoplasm of colon: Secondary | ICD-10-CM

## 2020-07-14 ENCOUNTER — Other Ambulatory Visit: Payer: Self-pay

## 2020-07-14 ENCOUNTER — Ambulatory Visit (INDEPENDENT_AMBULATORY_CARE_PROVIDER_SITE_OTHER): Payer: Medicare HMO

## 2020-07-14 ENCOUNTER — Ambulatory Visit: Payer: Medicare HMO | Admitting: Podiatry

## 2020-07-14 ENCOUNTER — Telehealth (INDEPENDENT_AMBULATORY_CARE_PROVIDER_SITE_OTHER): Payer: Self-pay | Admitting: Gastroenterology

## 2020-07-14 ENCOUNTER — Other Ambulatory Visit: Payer: Self-pay | Admitting: Podiatry

## 2020-07-14 DIAGNOSIS — M7672 Peroneal tendinitis, left leg: Secondary | ICD-10-CM

## 2020-07-14 DIAGNOSIS — Z1211 Encounter for screening for malignant neoplasm of colon: Secondary | ICD-10-CM

## 2020-07-14 DIAGNOSIS — M7752 Other enthesopathy of left foot: Secondary | ICD-10-CM

## 2020-07-14 DIAGNOSIS — R2242 Localized swelling, mass and lump, left lower limb: Secondary | ICD-10-CM

## 2020-07-14 MED ORDER — PEG 3350-KCL-NA BICARB-NACL 420 G PO SOLR: 4000.0000 mL | Freq: Once | ORAL | 0 refills | Status: AC

## 2020-07-14 MED ORDER — MELOXICAM 15 MG PO TABS: 15.0000 mg | ORAL_TABLET | Freq: Every day | ORAL | 1 refills | Status: DC

## 2020-07-14 NOTE — Progress Notes (Signed)
   HPI: 49 y.o. female presenting today as a reestablish new patient for evaluation of pain to the lateral aspect of the left foot.  Patient states that a few days ago he perhaps remembered injuring, bumping her left foot against a hard object.  She noticed increased swelling to the lateral aspect of the left foot.  It is very tender to touch.  It does not hurt to walk on however it is very sensitive.  She has not done anything for treatment.  She presents for further treatment evaluation  Past Medical History:  Diagnosis Date  . Anxiety   . Fibromyalgia   . Migraine   . Panic disorder      Physical Exam: General: The patient is alert and oriented x3 in no acute distress.  Dermatology: Skin is warm, dry and supple bilateral lower extremities. Negative for open lesions or macerations.  Vascular: Palpable pedal pulses bilaterally. No edema or erythema noted. Capillary refill within normal limits.  Neurological: Epicritic and protective threshold grossly intact bilaterally.   Musculoskeletal Exam: Range of motion within normal limits to all pedal and ankle joints bilateral. Muscle strength 5/5 in all groups bilateral.  Pain on palpation to the fifth metatarsal tubercle and the insertion of the peroneal tendon.  There is some fluctuance around the area that may be consistent with a bursitis  Radiographic Exam:  Normal osseous mineralization. Joint spaces preserved. No fracture/dislocation/boney destruction.    Assessment: 1.  Peroneal tendinitis left  Plan of Care:  1. Patient evaluated. X-Rays reviewed.  2.  Injection of 0.5 cc Celestone Soluspan injected along the peroneal tendon sheath at the insertion of the fifth metatarsal tubercle 3.  Prescription for meloxicam 15 mg daily 4.  Compression anklet dispensed.  Wear daily 5.  Return to clinic as needed      Edrick Kins, DPM [facility]  Dr. Edrick Kins, DPM    [address], [place] [address]                Office [phone]  Fax [phone]

## 2020-07-14 NOTE — Progress Notes (Signed)
Gastroenterology Pre-Procedure Review  Request Date: 08/04/20 Requesting Physician: Dr. Marius Ditch  PATIENT REVIEW QUESTIONS: The patient responded to the following health history questions as indicated:    1. Are you having any GI issues? no 2. Do you have a personal history of Polyps? no 3. Do you have a family history of Colon Cancer or Polyps? no 4. Diabetes Mellitus? no 5. Joint replacements in the past 12 months?no 6. Major health problems in the past 3 months?no 7. Any artificial heart valves, MVP, or defibrillator?no    MEDICATIONS & ALLERGIES:    Patient reports the following regarding taking any anticoagulation/antiplatelet therapy:   Plavix, Coumadin, Eliquis, Xarelto, Lovenox, Pradaxa, Brilinta, or Effient? no Aspirin? no  Patient confirms/reports the following medications:  Current Outpatient Medications  Medication Sig Dispense Refill  . albuterol (PROVENTIL HFA;VENTOLIN HFA) 108 (90 Base) MCG/ACT inhaler Inhale into the lungs.    . ALPRAZolam (XANAX XR) 2 MG 24 hr tablet Take 2 mg by mouth 3 (three) times daily.     Marland Kitchen ALPRAZolam (XANAX) 1 MG tablet Take 1 mg by mouth 3 (three) times daily as needed.    . butalbital-acetaminophen-caffeine (FIORICET, ESGIC) 50-325-40 MG tablet Take 1 tablet by mouth every 6 (six) hours as needed for headache.     . furosemide (LASIX) 20 MG tablet Take 20 mg by mouth daily as needed for fluid.    Marland Kitchen lidocaine (XYLOCAINE) 2 % solution Use as directed 15 mLs in the mouth or throat every 6 (six) hours as needed for mouth pain. 150 mL 1  . montelukast (SINGULAIR) 10 MG tablet Take 10 mg by mouth daily.    Marland Kitchen morphine (MSIR) 30 MG tablet Take 30 mg by mouth daily.     Marland Kitchen neomycin-polymyxin-hydrocortisone (CORTISPORIN) OTIC solution Apply 1-2 drops to the toe after soaking toe twice a day 10 mL 0  . predniSONE (DELTASONE) 10 MG tablet 6 PO Q D X 1 DAY, THEN 5 PO Q D X 1 DAY, THEN 4 PO Q D X 1 DAY, THEN 3 PO Q D X 1 DAY, THEN 2 PO Q D X 1 DAY, THEN 1 PO  Q D X 1 DAY    . pregabalin (LYRICA) 150 MG capsule Take 150 mg by mouth 2 (two) times daily.    . SYMBICORT 80-4.5 MCG/ACT inhaler Inhale into the lungs.    . traZODone (DESYREL) 100 MG tablet Take 100 mg by mouth at bedtime.    Marland Kitchen venlafaxine XR (EFFEXOR-XR) 75 MG 24 hr capsule Take 75 mg by mouth daily with breakfast.    . polyethylene glycol-electrolytes (NULYTELY) 420 g solution Take 4,000 mLs by mouth once for 1 dose. 4000 mL 0   No current facility-administered medications for this visit.    Patient confirms/reports the following allergies:  No Known Allergies  Orders Placed This Encounter  Procedures  . Procedural/ Surgical Case Request: COLONOSCOPY WITH PROPOFOL    Standing Status:   Standing    Number of Occurrences:   1    Order Specific Question:   Pre-op diagnosis    Answer:   screening colonoscopy    Order Specific Question:   CPT Code    Answer:   16109    AUTHORIZATION INFORMATION Primary Insurance: 1D#: Group #:  Secondary Insurance: 1D#: Group #:  SCHEDULE INFORMATION: Date: 08/04/20 Time: Location:[facility]

## 2020-07-31 ENCOUNTER — Other Ambulatory Visit
Admission: RE | Admit: 2020-07-31 | Discharge: 2020-07-31 | Disposition: A | Discharge status: Home / Self Care | Payer: Medicare HMO | Source: Ambulatory Visit | Attending: Gastroenterology | Admitting: Gastroenterology

## 2020-07-31 ENCOUNTER — Other Ambulatory Visit: Payer: Self-pay

## 2020-07-31 DIAGNOSIS — Z20822 Contact with and (suspected) exposure to covid-19: Secondary | ICD-10-CM | POA: Diagnosis not present

## 2020-07-31 DIAGNOSIS — Z01812 Encounter for preprocedural laboratory examination: Secondary | ICD-10-CM | POA: Insufficient documentation

## 2020-07-31 LAB — SARS CORONAVIRUS 2 (TAT 6-24 HRS): SARS Coronavirus 2: NEGATIVE

## 2020-08-04 ENCOUNTER — Encounter: Payer: Self-pay | Admitting: Gastroenterology

## 2020-08-04 ENCOUNTER — Ambulatory Visit: Payer: Medicare HMO | Admitting: Anesthesiology

## 2020-08-04 ENCOUNTER — Ambulatory Visit
Admission: RE | Admit: 2020-08-04 | Discharge: 2020-08-04 | Disposition: A | Discharge status: Home / Self Care | Payer: Medicare HMO | Attending: Gastroenterology | Admitting: Gastroenterology

## 2020-08-04 ENCOUNTER — Other Ambulatory Visit: Payer: Self-pay

## 2020-08-04 ENCOUNTER — Encounter
Admission: RE | Disposition: A | Discharge status: Home / Self Care | Payer: Self-pay | Source: Home / Self Care | Attending: Gastroenterology

## 2020-08-04 DIAGNOSIS — Z791 Long term (current) use of non-steroidal anti-inflammatories (NSAID): Secondary | ICD-10-CM | POA: Insufficient documentation

## 2020-08-04 DIAGNOSIS — Z7951 Long term (current) use of inhaled steroids: Secondary | ICD-10-CM | POA: Insufficient documentation

## 2020-08-04 DIAGNOSIS — F17210 Nicotine dependence, cigarettes, uncomplicated: Secondary | ICD-10-CM | POA: Diagnosis not present

## 2020-08-04 DIAGNOSIS — G43909 Migraine, unspecified, not intractable, without status migrainosus: Secondary | ICD-10-CM | POA: Insufficient documentation

## 2020-08-04 DIAGNOSIS — Z79899 Other long term (current) drug therapy: Secondary | ICD-10-CM | POA: Diagnosis not present

## 2020-08-04 DIAGNOSIS — D124 Benign neoplasm of descending colon: Secondary | ICD-10-CM | POA: Diagnosis not present

## 2020-08-04 DIAGNOSIS — F419 Anxiety disorder, unspecified: Secondary | ICD-10-CM | POA: Diagnosis not present

## 2020-08-04 DIAGNOSIS — F410 Panic disorder [episodic paroxysmal anxiety] without agoraphobia: Secondary | ICD-10-CM | POA: Insufficient documentation

## 2020-08-04 DIAGNOSIS — Z79891 Long term (current) use of opiate analgesic: Secondary | ICD-10-CM | POA: Diagnosis not present

## 2020-08-04 DIAGNOSIS — J45909 Unspecified asthma, uncomplicated: Secondary | ICD-10-CM | POA: Insufficient documentation

## 2020-08-04 DIAGNOSIS — M797 Fibromyalgia: Secondary | ICD-10-CM | POA: Diagnosis not present

## 2020-08-04 DIAGNOSIS — Z1211 Encounter for screening for malignant neoplasm of colon: Secondary | ICD-10-CM

## 2020-08-04 HISTORY — PX: COLONOSCOPY WITH PROPOFOL: SHX5780

## 2020-08-04 LAB — POCT PREGNANCY, URINE: Preg Test, Ur: NEGATIVE

## 2020-08-04 SURGERY — COLONOSCOPY WITH PROPOFOL
Anesthesia: General

## 2020-08-04 MED ORDER — MIDAZOLAM HCL 2 MG/2ML IJ SOLN
INTRAMUSCULAR | Status: DC | PRN
  Administered 2020-08-04: 2 mg via INTRAVENOUS

## 2020-08-04 MED ORDER — MIDAZOLAM HCL 2 MG/2ML IJ SOLN
INTRAMUSCULAR | Status: AC
  Filled 2020-08-04: qty 2

## 2020-08-04 MED ORDER — PROPOFOL 500 MG/50ML IV EMUL
INTRAVENOUS | Status: AC
  Filled 2020-08-04: qty 50

## 2020-08-04 MED ORDER — PROPOFOL 500 MG/50ML IV EMUL
INTRAVENOUS | Status: DC | PRN
  Administered 2020-08-04: 150 ug/kg/min via INTRAVENOUS

## 2020-08-04 MED ORDER — SODIUM CHLORIDE 0.9 % IV SOLN: INTRAVENOUS | Status: DC

## 2020-08-04 NOTE — Anesthesia Preprocedure Evaluation (Signed)
Anesthesia Evaluation  Patient identified by MRN, date of birth, ID band Patient awake    Reviewed: Allergy & Precautions, H&P , NPO status , Patient's Chart, lab work & pertinent test results  History of Anesthesia Complications Negative for: history of anesthetic complications  Airway Mallampati: II  TM Distance: <3 FB     Dental  (+) Teeth Intact   Pulmonary asthma , neg sleep apnea, neg COPD, Current Smoker and Patient abstained from smoking.,    breath sounds clear to auscultation       Cardiovascular (-) angina(-) Past MI and (-) Cardiac Stents negative cardio ROS  (-) dysrhythmias  Rhythm:regular Rate:Normal     Neuro/Psych  Headaches, PSYCHIATRIC DISORDERS Anxiety Depression    GI/Hepatic negative GI ROS, Neg liver ROS,   Endo/Other  negative endocrine ROS  Renal/GU negative Renal ROS  negative genitourinary   Musculoskeletal  (+) Fibromyalgia -  Abdominal   Peds  Hematology negative hematology ROS (+)   Anesthesia Other Findings Past Medical History: No date: Anxiety No date: Fibromyalgia No date: Migraine No date: Panic disorder  Past Surgical History: No date: AUGMENTATION MAMMAPLASTY No date: CESAREAN SECTION  BMI    Body Mass Index: 21.14 kg/m      Reproductive/Obstetrics negative OB ROS                             Anesthesia Physical Anesthesia Plan  ASA: II  Anesthesia Plan: General   Post-op Pain Management:    Induction:   PONV Risk Score and Plan: Propofol infusion and TIVA  Airway Management Planned: Nasal Cannula  Additional Equipment:   Intra-op Plan:   Post-operative Plan:   Informed Consent: I have reviewed the patients History and Physical, chart, labs and discussed the procedure including the risks, benefits and alternatives for the proposed anesthesia with the patient or authorized representative who has indicated his/her understanding  and acceptance.     Dental Advisory Given  Plan Discussed with: Anesthesiologist, CRNA and Surgeon  Anesthesia Plan Comments:         Anesthesia Quick Evaluation

## 2020-08-04 NOTE — Op Note (Signed)
[facility] Gastroenterology Patient Name: Felicia [name] Procedure Date: 08/04/2020 8:59 AM MRN: [id number] Account #: [id number] Date of Birth: [birth year] Admit Type: Outpatient Age: 49 Room: [facility] ENDO ROOM 1 Gender: Female Note Status: Finalized Procedure:             Colonoscopy Indications:           Screening for colorectal malignant neoplasm Providers:             Lin Landsman MD, MD Referring MD:          Leona Carry. Hall Busing, MD (Referring MD) Medicines:             Monitored Anesthesia Care Complications:         No immediate complications. Estimated blood loss: None. Procedure:             Pre-Anesthesia Assessment:                        - Prior to the procedure, a History and Physical was                         performed, and patient medications and allergies were                         reviewed. The patient is competent. The risks and                         benefits of the procedure and the sedation options and                         risks were discussed with the patient. All questions                         were answered and informed consent was obtained.                         Patient identification and proposed procedure were                         verified by the physician, the nurse, the                         anesthesiologist, the anesthetist and the technician                         in the pre-procedure area in the procedure room in the                         endoscopy suite. Mental Status Examination: alert and                         oriented. Airway Examination: normal oropharyngeal                         airway and neck mobility. Respiratory Examination:                         clear to auscultation. CV Examination: normal.  Prophylactic Antibiotics: The patient does not require                         prophylactic antibiotics. Prior Anticoagulants: The                         patient has taken no previous  anticoagulant or                         antiplatelet agents. ASA Grade Assessment: II - A                         patient with mild systemic disease. After reviewing                         the risks and benefits, the patient was deemed in                         satisfactory condition to undergo the procedure. The                         anesthesia plan was to use monitored anesthesia care                         (MAC). Immediately prior to administration of                         medications, the patient was re-assessed for adequacy                         to receive sedatives. The heart rate, respiratory                         rate, oxygen saturations, blood pressure, adequacy of                         pulmonary ventilation, and response to care were                         monitored throughout the procedure. The physical                         status of the patient was re-assessed after the                         procedure.                        After obtaining informed consent, the colonoscope was                         passed under direct vision. Throughout the procedure,                         the patient's blood pressure, pulse, and oxygen                         saturations were monitored continuously. The  Colonoscope was introduced through the anus and                         advanced to the the cecum, identified by appendiceal                         orifice and ileocecal valve. The colonoscopy was                         extremely difficult due to poor bowel prep with stool                         present and significant looping. Successful completion                         of the procedure was aided by applying abdominal                         pressure. The patient tolerated the procedure well.                         The quality of the bowel preparation was poor. Findings:      The perianal and digital rectal examinations were normal.  Pertinent       negatives include normal sphincter tone and no palpable rectal lesions.      Extensive amounts of copious quantities of semi-liquid stool was found       in the entire colon, precluding visualization.      A 5 mm polyp was found in the descending colon. The polyp was sessile.       The polyp was removed with a cold snare. Resection and retrieval were       complete.      The retroflexed view of the distal rectum and anal verge was normal and       showed no anal or rectal abnormalities. Impression:            - Preparation of the colon was poor.                        - Stool in the entire examined colon.                        - One 5 mm polyp in the descending colon, removed with                         a cold snare. Resected and retrieved.                        - The distal rectum and anal verge are normal on                         retroflexion view. Recommendation:        - Discharge patient to home (with escort).                        - Clear liquid diet today if patient is agreeable for  colonoscopy tomorrow.                        - Repeat colonoscopy tomorrow with repeat prep because                         the bowel preparation was poor, otherwise in 38month                         with 2 day prep. Procedure Code(s):     --- Professional ---                        [id number] Colonoscopy, flexible; with removal of                         tumor(s), polyp(s), or other lesion(s) by snare                         technique Diagnosis Code(s):     --- Professional ---                        Z12.11, Encounter for screening for malignant neoplasm                         of colon                        K63.5, Polyp of colon CPT copyright 2019 American Medical Association. All rights reserved. The codes documented in this report are preliminary and upon coder review may  be revised to meet current compliance requirements. Dr. RUlyess MortRLin LandsmanMD, MD 08/04/2020 9:43:50 AM This report has been signed electronically. Number of Addenda: 0 Note Initiated On: 08/04/2020 8:59 AM Scope Withdrawal Time: 0 hours 6 minutes 47 seconds  Total Procedure Duration: 0 hours 21 minutes 10 seconds  Estimated Blood Loss:  Estimated blood loss: none.      [facility]

## 2020-08-04 NOTE — H&P (Signed)
Cephas Darby, MD [address]  [facility]  [place], [place] [address]  Main: [phone]  Fax: [phone] Pager: [phone]  Primary Care Physician:  Albina Billet, MD Primary Gastroenterologist:  Dr. Cephas Darby  Pre-Procedure History & Physical: HPI:  Felicia [name] is a 49 y.o. female is here for an colonoscopy.   Past Medical History:  Diagnosis Date  . Anxiety   . Fibromyalgia   . Migraine   . Panic disorder     Past Surgical History:  Procedure Laterality Date  . AUGMENTATION MAMMAPLASTY    . CESAREAN SECTION      Prior to Admission medications   Medication Sig Start Date End Date Taking? Authorizing Provider  albuterol (PROVENTIL HFA;VENTOLIN HFA) 108 (90 Base) MCG/ACT inhaler Inhale into the lungs. 10/23/18   [provider]  ALPRAZolam (XANAX XR) 2 MG 24 hr tablet Take 2 mg by mouth 3 (three) times daily.     [provider]  ALPRAZolam Duanne Moron) 1 MG tablet Take 1 mg by mouth 3 (three) times daily as needed. 06/30/20   [provider]  butalbital-acetaminophen-caffeine (FIORICET, ESGIC) 50-325-40 MG tablet Take 1 tablet by mouth every 6 (six) hours as needed for headache.     [provider]  furosemide (LASIX) 20 MG tablet Take 20 mg by mouth daily as needed for fluid.    [provider]  lidocaine (XYLOCAINE) 2 % solution Use as directed 15 mLs in the mouth or throat every 6 (six) hours as needed for mouth pain. 10/31/18   Merlyn Lot, MD  meloxicam (MOBIC) 15 MG tablet Take 1 tablet (15 mg total) by mouth daily. 07/14/20   Edrick Kins, DPM  montelukast (SINGULAIR) 10 MG tablet Take 10 mg by mouth daily. 06/29/20   [provider]  morphine (MSIR) 30 MG tablet Take 30 mg by mouth daily.     [provider]  neomycin-polymyxin-hydrocortisone (CORTISPORIN) OTIC solution Apply 1-2 drops to the toe after soaking toe twice a day 10/25/18   Wallene Huh, DPM  predniSONE (DELTASONE) 10 MG  tablet 6 PO Q D X 1 DAY, THEN 5 PO Q D X 1 DAY, THEN 4 PO Q D X 1 DAY, THEN 3 PO Q D X 1 DAY, THEN 2 PO Q D X 1 DAY, THEN 1 PO Q D X 1 DAY 10/23/18   [provider]  pregabalin (LYRICA) 150 MG capsule Take 150 mg by mouth 2 (two) times daily.    [provider]  SYMBICORT 80-4.5 MCG/ACT inhaler Inhale into the lungs. 05/20/20   [provider]  traZODone (DESYREL) 100 MG tablet Take 100 mg by mouth at bedtime. 06/29/20   [provider]  venlafaxine XR (EFFEXOR-XR) 75 MG 24 hr capsule Take 75 mg by mouth daily with breakfast.    [provider]    Allergies as of 07/14/2020  . (No Known Allergies)    Family History  Problem Relation Age of Onset  . Cancer Maternal Uncle        colon  . Diabetes Maternal Grandfather   . Breast cancer Father     Social History   Socioeconomic History  . Marital status: Married    Spouse name: Not on file  . Number of children: Not on file  . Years of education: Not on file  . Highest education level: Not on file  Occupational History  . Not on file  Tobacco Use  .  Smoking status: Current Every Day Smoker    Packs/day: 0.25    Years: 30.00    Pack years: 7.50    Types: Cigarettes  . Smokeless tobacco: Never Used  Vaping Use  . Vaping Use: Some days  Substance and Sexual Activity  . Alcohol use: No  . Drug use: No  . Sexual activity: Yes    Birth control/protection: I.U.D.  Other Topics Concern  . Not on file  Social History Narrative  . Not on file   Social Determinants of Health   Financial Resource Strain:   . Difficulty of Paying Living Expenses:   Food Insecurity:   . Worried About Charity fundraiser in the Last Year:   . Arboriculturist in the Last Year:   Transportation Needs:   . Film/video editor (Medical):   Marland Kitchen Lack of Transportation (Non-Medical):   Physical Activity:   . Days of Exercise per Week:   . Minutes of Exercise per Session:   Stress:   . Feeling of Stress :    Social Connections:   . Frequency of Communication with Friends and Family:   . Frequency of Social Gatherings with Friends and Family:   . Attends Religious Services:   . Active Member of Clubs or Organizations:   . Attends Archivist Meetings:   Marland Kitchen Marital Status:   Intimate Partner Violence:   . Fear of Current or Ex-Partner:   . Emotionally Abused:   Marland Kitchen Physically Abused:   . Sexually Abused:     Review of Systems: See HPI, otherwise negative ROS  Physical Exam: BP 127/84   Pulse 68   Temp 98.3 F (36.8 C) (Temporal)   Resp 17   Ht 5\' 7"  (1.702 m)   Wt 61.2 kg   SpO2 100%   BMI 21.14 kg/m  General:   Alert,  pleasant and cooperative in NAD Head:  Normocephalic and atraumatic. Neck:  Supple; no masses or thyromegaly. Lungs:  Clear throughout to auscultation.    Heart:  Regular rate and rhythm. Abdomen:  Soft, nontender and nondistended. Normal bowel sounds, without guarding, and without rebound.   Neurologic:  Alert and  oriented x4;  grossly normal neurologically.  Impression/Plan: Felicia [name] is here for an colonoscopy to be performed for colon cancer screening  Risks, benefits, limitations, and alternatives regarding  colonoscopy have been reviewed with the patient.  Questions have been answered.  All parties agreeable.   Sherri Sear, MD  08/04/2020, 8:07 AM

## 2020-08-04 NOTE — Transfer of Care (Signed)
Immediate Anesthesia Transfer of Care Note  Patient: Felicia [name]  Procedure(s) Performed: COLONOSCOPY WITH PROPOFOL (N/A )  Patient Location: PACU  Anesthesia Type:General  Level of Consciousness: awake and sedated  Airway & Oxygen Therapy: Patient Spontanous Breathing and Patient connected to nasal cannula oxygen  Post-op Assessment: Report given to RN and Post -op Vital signs reviewed and stable  Post vital signs: Reviewed and stable  Last Vitals:  Vitals Value Taken Time  BP    Temp    Pulse    Resp    SpO2      Last Pain:  Vitals:   08/04/20 0757  TempSrc: Temporal  PainSc: 0-No pain         Complications: No complications documented.

## 2020-08-04 NOTE — Anesthesia Procedure Notes (Signed)
Performed by: Cook-Martin, Halcyon Heck Pre-anesthesia Checklist: Patient identified, Emergency Drugs available, Suction available, Patient being monitored and Timeout performed Patient Re-evaluated:Patient Re-evaluated prior to induction Oxygen Delivery Method: Nasal cannula Preoxygenation: Pre-oxygenation with 100% oxygen Induction Type: IV induction Placement Confirmation: positive ETCO2 and CO2 detector       

## 2020-08-05 LAB — SURGICAL PATHOLOGY

## 2020-08-06 ENCOUNTER — Encounter: Payer: Self-pay | Admitting: Gastroenterology

## 2020-08-06 NOTE — Anesthesia Postprocedure Evaluation (Signed)
Anesthesia Post Note  Patient: Felicia [name]  Procedure(s) Performed: COLONOSCOPY WITH PROPOFOL (N/A )  Patient location during evaluation: PACU Anesthesia Type: General Level of consciousness: awake and alert Pain management: pain level controlled Vital Signs Assessment: post-procedure vital signs reviewed and stable Respiratory status: spontaneous breathing, nonlabored ventilation and respiratory function stable Cardiovascular status: blood pressure returned to baseline and stable Postop Assessment: no apparent nausea or vomiting Anesthetic complications: no   No complications documented.   Last Vitals:  Vitals:   08/04/20 1005 08/04/20 1010  BP: (!) 116/103 123/80  Pulse: (!) 54 (!) 56  Resp: (!) 25 14  Temp:    SpO2: 99% 100%    Last Pain:  Vitals:   08/04/20 0945  TempSrc: Temporal  PainSc:                  [name]

## 2020-08-29 ENCOUNTER — Other Ambulatory Visit: Payer: Self-pay | Admitting: Podiatry

## 2020-09-16 ENCOUNTER — Other Ambulatory Visit: Payer: Self-pay | Admitting: Specialist

## 2020-09-16 DIAGNOSIS — R0609 Other forms of dyspnea: Secondary | ICD-10-CM

## 2020-09-16 DIAGNOSIS — J441 Chronic obstructive pulmonary disease with (acute) exacerbation: Secondary | ICD-10-CM

## 2020-09-16 DIAGNOSIS — R0789 Other chest pain: Secondary | ICD-10-CM

## 2020-09-24 ENCOUNTER — Ambulatory Visit
Admission: RE | Admit: 2020-09-24 | Discharge: 2020-09-24 | Disposition: A | Discharge status: Home / Self Care | Payer: Medicare HMO | Source: Ambulatory Visit | Attending: Specialist | Admitting: Specialist

## 2020-09-24 ENCOUNTER — Other Ambulatory Visit: Payer: Self-pay

## 2020-09-24 DIAGNOSIS — J441 Chronic obstructive pulmonary disease with (acute) exacerbation: Secondary | ICD-10-CM

## 2020-09-24 DIAGNOSIS — R0609 Other forms of dyspnea: Secondary | ICD-10-CM

## 2020-09-24 DIAGNOSIS — R0789 Other chest pain: Secondary | ICD-10-CM | POA: Insufficient documentation

## 2020-09-24 DIAGNOSIS — J45901 Unspecified asthma with (acute) exacerbation: Secondary | ICD-10-CM | POA: Diagnosis present

## 2020-09-24 DIAGNOSIS — R0600 Dyspnea, unspecified: Secondary | ICD-10-CM | POA: Insufficient documentation

## 2020-10-06 ENCOUNTER — Other Ambulatory Visit: Payer: Self-pay | Admitting: Internal Medicine

## 2020-10-06 DIAGNOSIS — R112 Nausea with vomiting, unspecified: Secondary | ICD-10-CM

## 2020-10-13 ENCOUNTER — Other Ambulatory Visit: Payer: Self-pay

## 2020-10-13 ENCOUNTER — Ambulatory Visit
Admission: RE | Admit: 2020-10-13 | Discharge: 2020-10-13 | Disposition: A | Discharge status: Home / Self Care | Payer: Medicare HMO | Source: Ambulatory Visit | Attending: Internal Medicine | Admitting: Internal Medicine

## 2020-10-13 DIAGNOSIS — R112 Nausea with vomiting, unspecified: Secondary | ICD-10-CM | POA: Diagnosis present

## 2021-07-02 ENCOUNTER — Ambulatory Visit: Payer: Medicare HMO | Admitting: Podiatry

## 2021-07-02 ENCOUNTER — Other Ambulatory Visit: Payer: Self-pay

## 2021-07-02 ENCOUNTER — Encounter: Payer: Self-pay | Admitting: Podiatry

## 2021-07-02 DIAGNOSIS — M778 Other enthesopathies, not elsewhere classified: Secondary | ICD-10-CM

## 2021-07-02 DIAGNOSIS — M7672 Peroneal tendinitis, left leg: Secondary | ICD-10-CM

## 2021-07-02 MED ORDER — BETAMETHASONE SOD PHOS & ACET 6 (3-3) MG/ML IJ SUSP
3.0000 mg | Freq: Once | INTRAMUSCULAR | Status: AC
  Administered 2021-07-02: 3 mg via INTRA_ARTICULAR

## 2021-07-02 MED ORDER — MELOXICAM 15 MG PO TABS: 15.0000 mg | ORAL_TABLET | Freq: Every day | ORAL | 1 refills | Status: DC

## 2021-07-02 NOTE — Progress Notes (Signed)
   HPI: 50 y.o. female presenting today as an established patient for new complaint for evaluation of pain to the lateral aspect of the left foot.  Patient had the same condition approximately 1 year ago when she got completely better.  Patient states that a few days ago she was mowing her lawn and sandals and reaggravated her foot she noticed increased swelling to the lateral aspect of the left foot.  It is very tender to touch.  It does not hurt to walk on however it is very sensitive.  She has not done anything for treatment.  She presents for further treatment evaluation  Past Medical History:  Diagnosis Date   Anxiety    Fibromyalgia    Migraine    Panic disorder      Physical Exam: General: The patient is alert and oriented x3 in no acute distress.  Dermatology: Skin is warm, dry and supple bilateral lower extremities. Negative for open lesions or macerations.  Vascular: Palpable pedal pulses bilaterally. No edema or erythema noted. Capillary refill within normal limits.  Neurological: Epicritic and protective threshold grossly intact bilaterally.   Musculoskeletal Exam: Range of motion within normal limits to all pedal and ankle joints bilateral. Muscle strength 5/5 in all groups bilateral.  Pain on palpation to the fifth metatarsal tubercle and the insertion of the peroneal tendon.  There is some fluctuance around the area that may be consistent with a bursitis   Assessment: 1.  Peroneal tendinitis left  Plan of Care:  1. Patient evaluated. X-Rays reviewed.  2.  Injection of 0.5 cc Celestone Soluspan injected along the peroneal tendon sheath at the insertion of the fifth metatarsal tubercle 3.  Prescription for meloxicam 15 mg daily 4.  Compression anklet dispensed.  Wear daily 5.  Return to clinic as needed      Edrick Kins, DPM [facility]  Dr. Edrick Kins, DPM    [address],  [address]                 Office [phone]  Fax [phone]

## 2021-09-17 DIAGNOSIS — J449 Chronic obstructive pulmonary disease, unspecified: Secondary | ICD-10-CM | POA: Diagnosis not present

## 2021-12-08 DIAGNOSIS — M797 Fibromyalgia: Secondary | ICD-10-CM | POA: Diagnosis not present

## 2021-12-08 DIAGNOSIS — Z23 Encounter for immunization: Secondary | ICD-10-CM | POA: Diagnosis not present

## 2021-12-08 DIAGNOSIS — F419 Anxiety disorder, unspecified: Secondary | ICD-10-CM | POA: Diagnosis not present

## 2021-12-08 DIAGNOSIS — F411 Generalized anxiety disorder: Secondary | ICD-10-CM | POA: Diagnosis not present

## 2021-12-08 DIAGNOSIS — F32A Depression, unspecified: Secondary | ICD-10-CM | POA: Diagnosis not present

## 2021-12-08 DIAGNOSIS — F330 Major depressive disorder, recurrent, mild: Secondary | ICD-10-CM | POA: Diagnosis not present

## 2022-06-14 DIAGNOSIS — K219 Gastro-esophageal reflux disease without esophagitis: Secondary | ICD-10-CM | POA: Diagnosis not present

## 2022-06-14 DIAGNOSIS — F418 Other specified anxiety disorders: Secondary | ICD-10-CM | POA: Diagnosis not present

## 2022-06-14 DIAGNOSIS — M797 Fibromyalgia: Secondary | ICD-10-CM | POA: Diagnosis not present

## 2022-11-22 DIAGNOSIS — R519 Headache, unspecified: Secondary | ICD-10-CM | POA: Diagnosis not present

## 2022-11-22 DIAGNOSIS — M797 Fibromyalgia: Secondary | ICD-10-CM | POA: Diagnosis not present

## 2022-11-22 DIAGNOSIS — F418 Other specified anxiety disorders: Secondary | ICD-10-CM | POA: Diagnosis not present

## 2022-12-28 DIAGNOSIS — R918 Other nonspecific abnormal finding of lung field: Secondary | ICD-10-CM | POA: Diagnosis not present

## 2022-12-28 DIAGNOSIS — J4489 Other specified chronic obstructive pulmonary disease: Secondary | ICD-10-CM | POA: Diagnosis not present

## 2022-12-28 DIAGNOSIS — Z87891 Personal history of nicotine dependence: Secondary | ICD-10-CM | POA: Diagnosis not present

## 2023-01-26 DIAGNOSIS — M9905 Segmental and somatic dysfunction of pelvic region: Secondary | ICD-10-CM | POA: Diagnosis not present

## 2023-01-26 DIAGNOSIS — M955 Acquired deformity of pelvis: Secondary | ICD-10-CM | POA: Diagnosis not present

## 2023-01-26 DIAGNOSIS — M9903 Segmental and somatic dysfunction of lumbar region: Secondary | ICD-10-CM | POA: Diagnosis not present

## 2023-01-26 DIAGNOSIS — M5416 Radiculopathy, lumbar region: Secondary | ICD-10-CM | POA: Diagnosis not present

## 2023-01-27 DIAGNOSIS — M9905 Segmental and somatic dysfunction of pelvic region: Secondary | ICD-10-CM | POA: Diagnosis not present

## 2023-01-27 DIAGNOSIS — M5416 Radiculopathy, lumbar region: Secondary | ICD-10-CM | POA: Diagnosis not present

## 2023-01-27 DIAGNOSIS — M9903 Segmental and somatic dysfunction of lumbar region: Secondary | ICD-10-CM | POA: Diagnosis not present

## 2023-01-27 DIAGNOSIS — M955 Acquired deformity of pelvis: Secondary | ICD-10-CM | POA: Diagnosis not present

## 2023-01-30 ENCOUNTER — Telehealth: Payer: Self-pay

## 2023-01-30 DIAGNOSIS — M9905 Segmental and somatic dysfunction of pelvic region: Secondary | ICD-10-CM | POA: Diagnosis not present

## 2023-01-30 DIAGNOSIS — M9903 Segmental and somatic dysfunction of lumbar region: Secondary | ICD-10-CM | POA: Diagnosis not present

## 2023-01-30 DIAGNOSIS — M955 Acquired deformity of pelvis: Secondary | ICD-10-CM | POA: Diagnosis not present

## 2023-01-30 DIAGNOSIS — M5416 Radiculopathy, lumbar region: Secondary | ICD-10-CM | POA: Diagnosis not present

## 2023-01-30 NOTE — Telephone Encounter (Signed)
Please call patient to schedule an appointment for vaginal pain/tear. She prefers to see a female.

## 2023-02-01 ENCOUNTER — Ambulatory Visit: Payer: Medicare HMO | Admitting: Licensed Practical Nurse

## 2023-02-01 ENCOUNTER — Other Ambulatory Visit (HOSPITAL_COMMUNITY)
Admission: RE | Admit: 2023-02-01 | Discharge: 2023-02-01 | Disposition: A | Discharge status: Home / Self Care | Payer: Medicare HMO | Source: Ambulatory Visit | Attending: Licensed Practical Nurse | Admitting: Licensed Practical Nurse

## 2023-02-01 VITALS — BP 122/84 | HR 83 | Wt 150.5 lb

## 2023-02-01 DIAGNOSIS — S01512A Laceration without foreign body of oral cavity, initial encounter: Secondary | ICD-10-CM

## 2023-02-01 DIAGNOSIS — Z1151 Encounter for screening for human papillomavirus (HPV): Secondary | ICD-10-CM | POA: Diagnosis not present

## 2023-02-01 DIAGNOSIS — M955 Acquired deformity of pelvis: Secondary | ICD-10-CM | POA: Diagnosis not present

## 2023-02-01 DIAGNOSIS — Z124 Encounter for screening for malignant neoplasm of cervix: Secondary | ICD-10-CM

## 2023-02-01 DIAGNOSIS — N941 Unspecified dyspareunia: Secondary | ICD-10-CM

## 2023-02-01 DIAGNOSIS — Z01419 Encounter for gynecological examination (general) (routine) without abnormal findings: Secondary | ICD-10-CM | POA: Insufficient documentation

## 2023-02-01 DIAGNOSIS — M9905 Segmental and somatic dysfunction of pelvic region: Secondary | ICD-10-CM | POA: Diagnosis not present

## 2023-02-01 DIAGNOSIS — M9903 Segmental and somatic dysfunction of lumbar region: Secondary | ICD-10-CM | POA: Diagnosis not present

## 2023-02-01 DIAGNOSIS — M5416 Radiculopathy, lumbar region: Secondary | ICD-10-CM | POA: Diagnosis not present

## 2023-02-01 MED ORDER — ESTROGENS CONJUGATED 0.625 MG/GM VA CREA: 0.5000 g | TOPICAL_CREAM | VAGINAL | 12 refills | Status: DC

## 2023-02-01 NOTE — Progress Notes (Unsigned)
Gynecology Annual Exam  PCP: Albina Billet, MD  Chief Complaint:  Chief Complaint  Patient presents with   Vaginal Pain    History of Present Illness:Patient is a 52 y.o. G2P2002 presents for annual exam. The patient is here for an evaluation for a cut on her labia, also requesting a pap.  Reviewed today's apt was for her complaint and not an annual, pt state she is not going to come back. Visit converted to an annual exam. Pt reports her memory is not that great and unsure of the frequency of her past medical care.   Felicia [name] reports that she has become sexually after recently, when she has IC she has great pain that feels like shards of glass in her vagina, her partner has to stop the act because it is so painful. Her partner has taken a photo of her vulva, she can see a cut there. This cut has been present for about a month, she has put Nu skin on it-which helps a little. She has tried to have IC since the cut has been there, but it is always too painful. Pt tearful because she desires sex and she is worried about "meeting her partner's needs", her partner sound kind and is not forcing anything on her. He does stop when ever she asks. She does admit cuts do take a long time to heal for her. She has also tried coconut oil to the area. She has buriong with urination d/t the cut.   Pt denies a hx of HTN, states she is so worked up about her labia that her BP is high today.   LMP: No LMP recorded. (Menstrual status: IUD). Pt reports her period stopped a number of years ago, IUD removed in 2021    The patient is sexually active with one female. She has dyspareunia.  The patient does perform self breast exams.  There is no notable family history of breast or ovarian cancer in her family.  The patient wears seatbelts: yes.   The patient has regular exercise:  NO .  Has fibromyalgia   The patient denies current symptoms of depression.   Has both anxiety and depression, prefers not to take  medication, takes something on occasion for anxiety. Does not have a therapist.   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 (Menstrual status: IUD). Last Pap: Results were: 2021 no abnormalities  Last mammogram: 2021 Results were: BI-RAD I  Obstetric History: [id number]  Her children are 60 and 1 years old.   Family History:  Family History  Problem Relation Age of Onset   Cancer Maternal Uncle        colon   Diabetes Maternal Grandfather  Breast cancer Father     Social History:  Social History   Socioeconomic History   Marital status: Married    Spouse name: Not on file   Number of children: Not on file   Years of education: Not on file   Highest education level: Not on file  Occupational History   Not on file  Tobacco Use   Smoking status: Every Day    Packs/day: 0.25    Years: 30.00    Total pack years: 7.50    Types: Cigarettes   Smokeless tobacco: Never  Vaping Use   Vaping Use: Some days  Substance and Sexual Activity   Alcohol use: No   Drug use: No   Sexual activity: Yes    Birth control/protection: I.U.D.  Other Topics Concern   Not on file  Social History Narrative   Not on file   Social Determinants of Health    Financial Resource Strain: Not on file  Food Insecurity: Not on file  Transportation Needs: Not on file  Physical Activity: Not on file  Stress: Not on file  Social Connections: Not on file  Intimate Partner Violence: Not on file    Allergies:  No Known Allergies  Medications: Prior to Admission medications   Medication Sig Start Date End Date Taking? Authorizing Provider  albuterol (PROVENTIL HFA;VENTOLIN HFA) 108 (90 Base) MCG/ACT inhaler Inhale into the lungs. 10/23/18  Yes [provider]  ALPRAZolam (XANAX XR) 2 MG 24 hr tablet Take 2 mg by mouth 3 (three) times daily.    Yes [provider]  ALPRAZolam Duanne Moron) 1 MG tablet Take 1 mg by mouth 3 (three) times daily as needed. 06/30/20  Yes [provider]  butalbital-acetaminophen-caffeine (FIORICET, ESGIC) 50-325-40 MG tablet Take 1 tablet by mouth every 6 (six) hours as needed for headache.    Yes [provider]  furosemide (LASIX) 20 MG tablet Take 20 mg by mouth daily as needed for fluid.   Yes [provider]  lidocaine (XYLOCAINE) 2 % solution Use as directed 15 mLs in the mouth or throat every 6 (six) hours as needed for mouth pain. 10/31/18  Yes Merlyn Lot, MD  meloxicam (MOBIC) 15 MG tablet Take 1 tablet (15 mg total) by mouth daily. 07/02/21  Yes Edrick Kins, DPM  montelukast (SINGULAIR) 10 MG tablet Take 10 mg by mouth daily. 06/29/20  Yes [provider]  morphine (MSIR) 30 MG tablet Take 30 mg by mouth daily.   Yes [provider]  neomycin-polymyxin-hydrocortisone (CORTISPORIN) OTIC solution Apply 1-2 drops to the toe after soaking toe twice a day 10/25/18  Yes Regal, Tamala Fothergill, DPM  pregabalin (LYRICA) 150 MG capsule Take 150 mg by mouth 2 (two) times daily.   Yes [provider]  SYMBICORT 80-4.5 MCG/ACT inhaler Inhale into the lungs. 05/20/20  Yes [provider]  traZODone (DESYREL) 100 MG tablet Take 100 mg by mouth at bedtime.  06/29/20  Yes [provider]  venlafaxine XR (EFFEXOR-XR) 75 MG 24 hr capsule Take 75 mg by mouth daily with breakfast.   Yes [provider]    Physical Exam Vitals: Blood pressure (!) 141/101, pulse 81, weight 150 lb 8 oz (68.3 kg).  General: NAD HEENT: normocephalic, anicteric Thyroid: no enlargement, no palpable nodules Pulmonary: No increased work of breathing, CTAB Cardiovascular: RRR, distal pulses 2+ Breast: Breast symmetrical, no tenderness, no palpable nodules or masses, no skin or nipple retraction present, no nipple discharge.  No axillary  or supraclavicular lymphadenopathy. Breast implants present Abdomen: NABS, soft, non-tender, non-distended.  Umbilicus without lesions.  No hepatomegaly, splenomegaly or masses palpable. No evidence of hernia  Genitourinary:  External: Normal external female genitalia-2-3cm laceration on Right upper labia, no bleeding or swelling, a little tender to touch.   Normal urethral meatus, normal Bartholin's and Skene's glands.    Vagina: Normal vaginal mucosa, no evidence of prolapse.    Cervix: Grossly normal in appearance, no bleeding  Uterus: Non-enlarged, mobile, normal contour.  No CMT  Adnexa: ovaries non-enlarged, no adnexal masses  Rectal: deferred  Lymphatic: no evidence of inguinal lymphadenopathy Extremities: no edema, erythema, or tenderness Neurologic: Grossly intact Psychiatric: mood appropriate, affect full    Assessment: 52 y.o. G2P2002 routine annual exam  Plan: Problem List Items Addressed This Visit   None Visit Diagnoses     Well woman exam    -  Primary   Relevant Medications   conjugated estrogens (PREMARIN) vaginal cream   Other Relevant Orders   Cytology - PAP   Cervical cancer screening       Relevant Orders   Cytology - PAP   Dyspareunia in female       Relevant Medications   conjugated estrogens (PREMARIN) vaginal cream       1) Mammogram - recommend yearly screening mammogram.   Mammogram Was ordered today  2) STI screening  wasoffered and declined  3) ASCCP guidelines and rational discussed.  Patient opts for every 5 years screening interval  4) Osteoporosis  - per USPTF routine screening DEXA at age 25   Consider FDA-approved medical therapies in postmenopausal women and men aged 20 years and older, based on the following: a) A hip or vertebral (clinical or morphometric) fracture b) T-score ? -2.5 at the femoral neck or spine after appropriate evaluation to exclude secondary causes C) Low bone mass (T-score between -1.0 and -2.5 at the femoral neck or spine) and a 10-year probability of a hip fracture ? 3% or a 10-year probability of a major osteoporosis-related fracture ? 20% based on the [place]-adapted WHO algorithm   5) Routine healthcare maintenance including cholesterol, diabetes screening discussed managed by PCP  6) Colonoscopy Has previously been recommended to have done, encouraged pt to do so as polyps were found on a previous exam. .  Screening recommended starting at age 28 for average risk individuals, age 77 for individuals deemed at increased risk (including African Americans) and recommended to continue until age 50.  For patient age 77-85 individualized approach is recommended.  Gold standard screening is via colonoscopy, Cologuard screening is an acceptable alternative for patient unwilling or unable to undergo colonoscopy.  "Colorectal cancer screening for average?risk adults: 2018 guideline update from the American Cancer Society"CA: A Cancer Journal for Clinicians: May 24, 2017   7) Painful IC/vaginal atrophy: will try premarin. Pt does have a hx of Migraines, aware this medication is a caution with Migraines for VTE events, pt willing to accept the risks. She quit smoking a while ago, denies hx of HTN.  -rec abstaining from IC until the laceration heals, given her age she may expect a prolonged healing time.  Roberto Scales, CNM  Mosetta Pigeon,  [facility] 02/03/2023, 9:43 AM

## 2023-02-03 DIAGNOSIS — M955 Acquired deformity of pelvis: Secondary | ICD-10-CM | POA: Diagnosis not present

## 2023-02-03 DIAGNOSIS — M9905 Segmental and somatic dysfunction of pelvic region: Secondary | ICD-10-CM | POA: Diagnosis not present

## 2023-02-03 DIAGNOSIS — M9903 Segmental and somatic dysfunction of lumbar region: Secondary | ICD-10-CM | POA: Diagnosis not present

## 2023-02-03 DIAGNOSIS — M5416 Radiculopathy, lumbar region: Secondary | ICD-10-CM | POA: Diagnosis not present

## 2023-02-05 DIAGNOSIS — S01512A Laceration without foreign body of oral cavity, initial encounter: Secondary | ICD-10-CM | POA: Insufficient documentation

## 2023-02-06 DIAGNOSIS — M5416 Radiculopathy, lumbar region: Secondary | ICD-10-CM | POA: Diagnosis not present

## 2023-02-06 DIAGNOSIS — M955 Acquired deformity of pelvis: Secondary | ICD-10-CM | POA: Diagnosis not present

## 2023-02-06 DIAGNOSIS — M9905 Segmental and somatic dysfunction of pelvic region: Secondary | ICD-10-CM | POA: Diagnosis not present

## 2023-02-06 DIAGNOSIS — M9903 Segmental and somatic dysfunction of lumbar region: Secondary | ICD-10-CM | POA: Diagnosis not present

## 2023-02-06 LAB — CYTOLOGY - PAP
Chlamydia: NEGATIVE
Comment: NEGATIVE
Comment: NEGATIVE
Comment: NORMAL
Diagnosis: NEGATIVE
High risk HPV: NEGATIVE
Neisseria Gonorrhea: NEGATIVE

## 2023-02-28 DIAGNOSIS — M9903 Segmental and somatic dysfunction of lumbar region: Secondary | ICD-10-CM | POA: Diagnosis not present

## 2023-02-28 DIAGNOSIS — M5416 Radiculopathy, lumbar region: Secondary | ICD-10-CM | POA: Diagnosis not present

## 2023-02-28 DIAGNOSIS — M955 Acquired deformity of pelvis: Secondary | ICD-10-CM | POA: Diagnosis not present

## 2023-02-28 DIAGNOSIS — M797 Fibromyalgia: Secondary | ICD-10-CM | POA: Diagnosis not present

## 2023-02-28 DIAGNOSIS — M545 Low back pain, unspecified: Secondary | ICD-10-CM | POA: Diagnosis not present

## 2023-02-28 DIAGNOSIS — M47816 Spondylosis without myelopathy or radiculopathy, lumbar region: Secondary | ICD-10-CM | POA: Diagnosis not present

## 2023-02-28 DIAGNOSIS — M9905 Segmental and somatic dysfunction of pelvic region: Secondary | ICD-10-CM | POA: Diagnosis not present

## 2023-02-28 DIAGNOSIS — M47896 Other spondylosis, lumbar region: Secondary | ICD-10-CM | POA: Diagnosis not present

## 2023-03-06 ENCOUNTER — Telehealth: Payer: Self-pay

## 2023-03-06 ENCOUNTER — Other Ambulatory Visit: Payer: Self-pay

## 2023-03-06 ENCOUNTER — Other Ambulatory Visit: Payer: Self-pay | Admitting: Licensed Practical Nurse

## 2023-03-06 DIAGNOSIS — Z1231 Encounter for screening mammogram for malignant neoplasm of breast: Secondary | ICD-10-CM

## 2023-03-06 DIAGNOSIS — Z1211 Encounter for screening for malignant neoplasm of colon: Secondary | ICD-10-CM

## 2023-03-06 DIAGNOSIS — Z860100 Personal history of colon polyps, unspecified: Secondary | ICD-10-CM

## 2023-03-06 MED ORDER — SUTAB 1479-225-188 MG PO TABS: 12.0000 | ORAL_TABLET | Freq: Two times a day (BID) | ORAL | 0 refills | Status: AC

## 2023-03-06 NOTE — Progress Notes (Signed)
Per Lydia's Dominic CNM note on 02/01/23 orderd mammogram but order was not in. Placed order patient aware she's calling [facility] right.

## 2023-03-06 NOTE — Progress Notes (Signed)
Pt requesting referral for colonoscopy, referral placed Avilene Marrin, [facility]  03/06/23  11:35 AM

## 2023-03-06 NOTE — Telephone Encounter (Signed)
Gastroenterology Pre-Procedure Review  Request Date: 08/24/23 Requesting Physician: Dr. Marius Ditch  PATIENT REVIEW QUESTIONS: The patient responded to the following health history questions as indicated:    1. Are you having any GI issues? no 2. Do you have a personal history of Polyps? yes (last colonoscopy performed by Dr. Marius Ditch 08/04/20 polyps were present. Recommended a 2 day prep) Patient requested SuTabs for Colonoscopy prep. 3. Do you have a family history of Colon Cancer or Polyps? Possibly maternal uncle colon cancer 4. Diabetes Mellitus? no 5. Joint replacements in the past 12 months?no 6. Major health problems in the past 3 months?no 7. Any artificial heart valves, MVP, or defibrillator?no    MEDICATIONS & ALLERGIES:    Patient reports the following regarding taking any anticoagulation/antiplatelet therapy:   Plavix, Coumadin, Eliquis, Xarelto, Lovenox, Pradaxa, Brilinta, or Effient? no Aspirin? no  Patient confirms/reports the following medications:  Current Outpatient Medications  Medication Sig Dispense Refill   albuterol (PROVENTIL HFA;VENTOLIN HFA) 108 (90 Base) MCG/ACT inhaler Inhale into the lungs.     ALPRAZolam (XANAX XR) 2 MG 24 hr tablet Take 2 mg by mouth 3 (three) times daily.      ALPRAZolam (XANAX) 1 MG tablet Take 1 mg by mouth 3 (three) times daily as needed.     butalbital-acetaminophen-caffeine (FIORICET, ESGIC) 50-325-40 MG tablet Take 1 tablet by mouth every 6 (six) hours as needed for headache.      conjugated estrogens (PREMARIN) vaginal cream Place [id number] Applicatorfuls vaginally 2 (two) times a week. 42.5 g 12   furosemide (LASIX) 20 MG tablet Take 20 mg by mouth daily as needed for fluid.     lidocaine (XYLOCAINE) 2 % solution Use as directed 15 mLs in the mouth or throat every 6 (six) hours as needed for mouth pain. 150 mL 1   meloxicam (MOBIC) 15 MG tablet Take 1 tablet (15 mg total) by mouth daily. 30 tablet 1   montelukast (SINGULAIR) 10 MG tablet Take  10 mg by mouth daily.     morphine (MSIR) 30 MG tablet Take 30 mg by mouth daily.     neomycin-polymyxin-hydrocortisone (CORTISPORIN) OTIC solution Apply 1-2 drops to the toe after soaking toe twice a day 10 mL 0   pregabalin (LYRICA) 150 MG capsule Take 150 mg by mouth 2 (two) times daily.     SYMBICORT 80-4.5 MCG/ACT inhaler Inhale into the lungs.     traZODone (DESYREL) 100 MG tablet Take 100 mg by mouth at bedtime.     venlafaxine XR (EFFEXOR-XR) 75 MG 24 hr capsule Take 75 mg by mouth daily with breakfast.     No current facility-administered medications for this visit.    Patient confirms/reports the following allergies:  No Known Allergies  No orders of the defined types were placed in this encounter.   AUTHORIZATION INFORMATION Primary Insurance: 1D#: Group #:  Secondary Insurance: 1D#: Group #:  SCHEDULE INFORMATION: Date: 08/24/23 Time: Location: [facility]

## 2023-03-14 DIAGNOSIS — F330 Major depressive disorder, recurrent, mild: Secondary | ICD-10-CM | POA: Diagnosis not present

## 2023-03-14 DIAGNOSIS — G43909 Migraine, unspecified, not intractable, without status migrainosus: Secondary | ICD-10-CM | POA: Diagnosis not present

## 2023-03-27 ENCOUNTER — Ambulatory Visit
Admission: RE | Admit: 2023-03-27 | Discharge: 2023-03-27 | Disposition: A | Discharge status: Home / Self Care | Payer: Medicare HMO | Source: Ambulatory Visit | Attending: Licensed Practical Nurse | Admitting: Licensed Practical Nurse

## 2023-03-27 DIAGNOSIS — Z1231 Encounter for screening mammogram for malignant neoplasm of breast: Secondary | ICD-10-CM | POA: Diagnosis not present

## 2023-03-28 DIAGNOSIS — M9903 Segmental and somatic dysfunction of lumbar region: Secondary | ICD-10-CM | POA: Diagnosis not present

## 2023-03-28 DIAGNOSIS — M9905 Segmental and somatic dysfunction of pelvic region: Secondary | ICD-10-CM | POA: Diagnosis not present

## 2023-03-28 DIAGNOSIS — M5416 Radiculopathy, lumbar region: Secondary | ICD-10-CM | POA: Diagnosis not present

## 2023-03-28 DIAGNOSIS — M955 Acquired deformity of pelvis: Secondary | ICD-10-CM | POA: Diagnosis not present

## 2023-06-13 DIAGNOSIS — M797 Fibromyalgia: Secondary | ICD-10-CM | POA: Diagnosis not present

## 2023-06-13 DIAGNOSIS — F330 Major depressive disorder, recurrent, mild: Secondary | ICD-10-CM | POA: Diagnosis not present

## 2023-06-13 DIAGNOSIS — G43909 Migraine, unspecified, not intractable, without status migrainosus: Secondary | ICD-10-CM | POA: Diagnosis not present

## 2023-06-13 DIAGNOSIS — M5134 Other intervertebral disc degeneration, thoracic region: Secondary | ICD-10-CM | POA: Diagnosis not present

## 2023-07-10 DIAGNOSIS — R918 Other nonspecific abnormal finding of lung field: Secondary | ICD-10-CM | POA: Diagnosis not present

## 2023-07-10 DIAGNOSIS — Z87891 Personal history of nicotine dependence: Secondary | ICD-10-CM | POA: Diagnosis not present

## 2023-07-10 DIAGNOSIS — J4520 Mild intermittent asthma, uncomplicated: Secondary | ICD-10-CM | POA: Diagnosis not present

## 2023-07-26 ENCOUNTER — Other Ambulatory Visit: Payer: Self-pay

## 2023-07-26 DIAGNOSIS — Z87891 Personal history of nicotine dependence: Secondary | ICD-10-CM

## 2023-07-26 DIAGNOSIS — Z122 Encounter for screening for malignant neoplasm of respiratory organs: Secondary | ICD-10-CM

## 2023-08-09 DIAGNOSIS — G44209 Tension-type headache, unspecified, not intractable: Secondary | ICD-10-CM | POA: Diagnosis not present

## 2023-08-09 DIAGNOSIS — Z7951 Long term (current) use of inhaled steroids: Secondary | ICD-10-CM | POA: Diagnosis not present

## 2023-08-09 DIAGNOSIS — Z87891 Personal history of nicotine dependence: Secondary | ICD-10-CM | POA: Diagnosis not present

## 2023-08-09 DIAGNOSIS — J439 Emphysema, unspecified: Secondary | ICD-10-CM | POA: Diagnosis not present

## 2023-08-09 DIAGNOSIS — Z008 Encounter for other general examination: Secondary | ICD-10-CM | POA: Diagnosis not present

## 2023-08-09 DIAGNOSIS — R609 Edema, unspecified: Secondary | ICD-10-CM | POA: Diagnosis not present

## 2023-08-09 DIAGNOSIS — F325 Major depressive disorder, single episode, in full remission: Secondary | ICD-10-CM | POA: Diagnosis not present

## 2023-08-09 DIAGNOSIS — F411 Generalized anxiety disorder: Secondary | ICD-10-CM | POA: Diagnosis not present

## 2023-08-09 DIAGNOSIS — G629 Polyneuropathy, unspecified: Secondary | ICD-10-CM | POA: Diagnosis not present

## 2023-08-09 DIAGNOSIS — K219 Gastro-esophageal reflux disease without esophagitis: Secondary | ICD-10-CM | POA: Diagnosis not present

## 2023-08-09 DIAGNOSIS — R030 Elevated blood-pressure reading, without diagnosis of hypertension: Secondary | ICD-10-CM | POA: Diagnosis not present

## 2023-08-09 DIAGNOSIS — Z809 Family history of malignant neoplasm, unspecified: Secondary | ICD-10-CM | POA: Diagnosis not present

## 2023-08-09 DIAGNOSIS — N952 Postmenopausal atrophic vaginitis: Secondary | ICD-10-CM | POA: Diagnosis not present

## 2023-08-23 ENCOUNTER — Encounter: Payer: Self-pay | Admitting: Licensed Practical Nurse

## 2023-08-24 ENCOUNTER — Other Ambulatory Visit: Payer: Self-pay

## 2023-08-24 ENCOUNTER — Ambulatory Visit
Admission: RE | Admit: 2023-08-24 | Discharge: 2023-08-24 | Disposition: A | Discharge status: Home / Self Care | Payer: Medicare HMO | Attending: Gastroenterology | Admitting: Gastroenterology

## 2023-08-24 ENCOUNTER — Encounter: Payer: Self-pay | Admitting: Acute Care

## 2023-08-24 ENCOUNTER — Encounter
Admission: RE | Disposition: A | Discharge status: Home / Self Care | Payer: Self-pay | Source: Home / Self Care | Attending: Gastroenterology

## 2023-08-24 ENCOUNTER — Encounter: Payer: Self-pay | Admitting: Gastroenterology

## 2023-08-24 ENCOUNTER — Ambulatory Visit: Payer: Medicare HMO | Admitting: Certified Registered"

## 2023-08-24 DIAGNOSIS — F17210 Nicotine dependence, cigarettes, uncomplicated: Secondary | ICD-10-CM | POA: Insufficient documentation

## 2023-08-24 DIAGNOSIS — F410 Panic disorder [episodic paroxysmal anxiety] without agoraphobia: Secondary | ICD-10-CM | POA: Insufficient documentation

## 2023-08-24 DIAGNOSIS — M797 Fibromyalgia: Secondary | ICD-10-CM | POA: Diagnosis not present

## 2023-08-24 DIAGNOSIS — G43909 Migraine, unspecified, not intractable, without status migrainosus: Secondary | ICD-10-CM | POA: Diagnosis not present

## 2023-08-24 DIAGNOSIS — Z860100 Personal history of colon polyps, unspecified: Secondary | ICD-10-CM

## 2023-08-24 DIAGNOSIS — J4489 Other specified chronic obstructive pulmonary disease: Secondary | ICD-10-CM | POA: Diagnosis not present

## 2023-08-24 DIAGNOSIS — F32A Depression, unspecified: Secondary | ICD-10-CM | POA: Diagnosis not present

## 2023-08-24 DIAGNOSIS — Z09 Encounter for follow-up examination after completed treatment for conditions other than malignant neoplasm: Secondary | ICD-10-CM | POA: Diagnosis not present

## 2023-08-24 DIAGNOSIS — K635 Polyp of colon: Secondary | ICD-10-CM | POA: Diagnosis not present

## 2023-08-24 HISTORY — PX: COLONOSCOPY WITH PROPOFOL: SHX5780

## 2023-08-24 HISTORY — PX: POLYPECTOMY: SHX5525

## 2023-08-24 SURGERY — COLONOSCOPY WITH PROPOFOL
Anesthesia: General

## 2023-08-24 MED ORDER — SODIUM CHLORIDE 0.9 % IV SOLN: INTRAVENOUS | Status: DC

## 2023-08-24 MED ORDER — LIDOCAINE HCL (CARDIAC) PF 100 MG/5ML IV SOSY
PREFILLED_SYRINGE | INTRAVENOUS | Status: DC | PRN
  Administered 2023-08-24: 50 mg via INTRAVENOUS

## 2023-08-24 MED ORDER — PROPOFOL 10 MG/ML IV BOLUS
INTRAVENOUS | Status: DC | PRN
Start: 2023-08-24 — End: 2023-08-24
  Administered 2023-08-24: 70 ug via INTRAVENOUS
  Administered 2023-08-24 (×2): 10 ug via INTRAVENOUS

## 2023-08-24 MED ORDER — PROPOFOL 10 MG/ML IV BOLUS: INTRAVENOUS | Status: DC | PRN

## 2023-08-24 MED ORDER — PROPOFOL 500 MG/50ML IV EMUL
INTRAVENOUS | Status: DC | PRN
  Administered 2023-08-24: 150 ug/kg/min via INTRAVENOUS

## 2023-08-24 NOTE — Transfer of Care (Signed)
Immediate Anesthesia Transfer of Care Note  Patient: Felicia [name]  Procedure(s) Performed: COLONOSCOPY WITH PROPOFOL POLYPECTOMY  Patient Location: PACU and Endoscopy Unit  Anesthesia Type:General  Level of Consciousness: awake and drowsy  Airway & Oxygen Therapy: Patient Spontanous Breathing  Post-op Assessment: Report given to RN and Post -op Vital signs reviewed and stable  Post vital signs: Reviewed and stable  Last Vitals:  Vitals Value Taken Time  BP 99/86 08/24/23 0920  Temp    Pulse 67 08/24/23 0920  Resp 25 08/24/23 0920  SpO2 100 % 08/24/23 0920  Vitals shown include unfiled device data.  Last Pain:  Vitals:   08/24/23 0810  TempSrc: Temporal         Complications: No notable events documented.

## 2023-08-24 NOTE — Op Note (Signed)
[facility] Gastroenterology Patient Name: Felicia [name] Procedure Date: 08/24/2023 8:37 AM MRN: [id number] Account #: [id number] Date of Birth: June 17, 1971 Admit Type: Outpatient Age: 52 Room: [facility] ENDO ROOM 3 Gender: Female Note Status: Finalized Instrument Name: Nelda Marseille [id number] Procedure:             Colonoscopy Indications:           Surveillance: History of adenomatous polyps,                         inadequate prep on last exam (<48yr), Last colonoscopy:                         August 2021 Providers:             Toney Reil MD, MD Referring MD:          Jillene Bucks. Arlana Pouch, MD (Referring MD) Medicines:             General Anesthesia Complications:         No immediate complications. Estimated blood loss: None. Procedure:             Pre-Anesthesia Assessment:                        - Prior to the procedure, a History and Physical was                         performed, and patient medications and allergies were                         reviewed. The patient is competent. The risks and                         benefits of the procedure and the sedation options and                         risks were discussed with the patient. All questions                         were answered and informed consent was obtained.                         Patient identification and proposed procedure were                         verified by the physician, the nurse, the                         anesthesiologist, the anesthetist and the technician                         in the pre-procedure area in the procedure room in the                         endoscopy suite. Mental Status Examination: alert and                         oriented. Airway Examination: normal oropharyngeal  airway and neck mobility. Respiratory Examination:                         clear to auscultation. CV Examination: normal.                         Prophylactic Antibiotics: The patient does  not require                         prophylactic antibiotics. Prior Anticoagulants: The                         patient has taken no anticoagulant or antiplatelet                         agents. ASA Grade Assessment: II - A patient with mild                         systemic disease. After reviewing the risks and                         benefits, the patient was deemed in satisfactory                         condition to undergo the procedure. The anesthesia                         plan was to use general anesthesia. Immediately prior                         to administration of medications, the patient was                         re-assessed for adequacy to receive sedatives. The                         heart rate, respiratory rate, oxygen saturations,                         blood pressure, adequacy of pulmonary ventilation, and                         response to care were monitored throughout the                         procedure. The physical status of the patient was                         re-assessed after the procedure.                        After obtaining informed consent, the colonoscope was                         passed under direct vision. Throughout the procedure,                         the patient's blood pressure, pulse, and oxygen  saturations were monitored continuously. The                         Colonoscope was introduced through the anus and                         advanced to the the cecum, identified by appendiceal                         orifice and ileocecal valve. The colonoscopy was                         performed with moderate difficulty due to inadequate                         bowel prep and significant looping. Successful                         completion of the procedure was aided by applying                         abdominal pressure. The patient tolerated the                         procedure well. The quality of the bowel  preparation                         was fair. The ileocecal valve, appendiceal orifice,                         and rectum were photographed. Findings:      The perianal and digital rectal examinations were normal. Pertinent       negatives include normal sphincter tone and no palpable rectal lesions.      A 5 mm polyp was found in the transverse colon. The polyp was sessile.       The polyp was removed with a cold snare. Resection and retrieval were       complete.      The retroflexed view of the distal rectum and anal verge was normal and       showed no anal or rectal abnormalities. Impression:            - Preparation of the colon was fair.                        - One 5 mm polyp in the transverse colon, removed with                         a cold snare. Resected and retrieved.                        - The distal rectum and anal verge are normal on                         retroflexion view. Recommendation:        - Discharge patient to home (with escort).                        -  Resume previous diet today.                        - Continue present medications.                        - Await pathology results.                        - Repeat colonoscopy in 3 years with 2 day prep for                         surveillance. Procedure Code(s):     --- Professional ---                        [phone], Colonoscopy, flexible; with removal of                         tumor(s), polyp(s), or other lesion(s) by snare                         technique Diagnosis Code(s):     --- Professional ---                        Z86.010, Personal history of colonic polyps                        D12.3, Benign neoplasm of transverse colon (hepatic                         flexure or splenic flexure) CPT copyright 2022 American Medical Association. All rights reserved. The codes documented in this report are preliminary and upon coder review may  be revised to meet current compliance requirements. Dr. Libby Maw Toney Reil MD, MD 08/24/2023 9:19:54 AM This report has been signed electronically. Number of Addenda: 0 Note Initiated On: 08/24/2023 8:37 AM Scope Withdrawal Time: 0 hours 11 minutes 47 seconds  Total Procedure Duration: 0 hours 20 minutes 1 second  Estimated Blood Loss:  Estimated blood loss: none.      [facility]

## 2023-08-24 NOTE — H&P (Signed)
Felicia Repress, MD [address]  [address]  [place], [place] [address]  Main: [phone]  Fax: [phone] Pager: [phone]  Primary Care Physician:  Felicia Shaggy, MD Primary Gastroenterologist:  Dr. Arlyss [name]  Pre-Procedure History & Physical: HPI:  Felicia [name] is a 52 y.o. female is here for an colonoscopy.   Past Medical History:  Diagnosis Date   Anxiety    Fibromyalgia    Migraine    Panic disorder     Past Surgical History:  Procedure Laterality Date   AUGMENTATION MAMMAPLASTY     CESAREAN SECTION     COLONOSCOPY WITH PROPOFOL N/A 08/04/2020   Procedure: COLONOSCOPY WITH PROPOFOL;  Surgeon: Felicia Reil, MD;  Location: [facility] ENDOSCOPY;  Service: Gastroenterology;  Laterality: N/A;    Prior to Admission medications   Medication Sig Start Date End Date Taking? Authorizing Provider  albuterol (PROVENTIL HFA;VENTOLIN HFA) 108 (90 Base) MCG/ACT inhaler Inhale into the lungs. 10/23/18   [provider]  ALPRAZolam (XANAX XR) 2 MG 24 hr tablet Take 2 mg by mouth 3 (three) times daily.     [provider]  ALPRAZolam Prudy Feeler) 1 MG tablet Take 1 mg by mouth 3 (three) times daily as needed. 06/30/20   [provider]  butalbital-acetaminophen-caffeine (FIORICET, ESGIC) 50-325-40 MG tablet Take 1 tablet by mouth every 6 (six) hours as needed for headache.     [provider]  conjugated estrogens (PREMARIN) vaginal cream Place 0.25 Applicatorfuls vaginally 2 (two) times a week. 02/02/23   Dominic, Felicia [name], CNM  furosemide (LASIX) 20 MG tablet Take 20 mg by mouth daily as needed for fluid.    [provider]  lidocaine (XYLOCAINE) 2 % solution Use as directed 15 mLs in the mouth or throat every 6 (six) hours as needed for mouth pain. 10/31/18   Felicia Eddy, MD  meloxicam (MOBIC) 15 MG tablet Take 1 tablet (15 mg total) by mouth daily. 07/02/21   Felicia [name], DPM  montelukast (SINGULAIR) 10 MG tablet Take 10  mg by mouth daily. 06/29/20   [provider]  morphine (MSIR) 30 MG tablet Take 30 mg by mouth daily.    [provider]  neomycin-polymyxin-hydrocortisone (CORTISPORIN) OTIC solution Apply 1-2 drops to the toe after soaking toe twice a day 10/25/18   Felicia [name], DPM  pregabalin (LYRICA) 150 MG capsule Take 150 mg by mouth 2 (two) times daily.    [provider]  SYMBICORT 80-4.5 MCG/ACT inhaler Inhale into the lungs. 05/20/20   [provider]  traZODone (DESYREL) 100 MG tablet Take 100 mg by mouth at bedtime. 06/29/20   [provider]  venlafaxine XR (EFFEXOR-XR) 75 MG 24 hr capsule Take 75 mg by mouth daily with breakfast.    [provider]    Allergies as of 03/06/2023   (No Known Allergies)    Family History  Problem Relation Age of Onset   Cancer Maternal Uncle        colon   Diabetes Maternal Grandfather    Breast cancer Father     Social History   Socioeconomic History   Marital status: Married    Spouse name: Not on file   Number of children: Not on file   Years of education: Not on file   Highest education level: Not on file  Occupational History   Not on file  Tobacco Use   Smoking status: Every Day    Current packs/day:  0.25    Average packs/day: 0.3 packs/day for 30.0 years (7.5 ttl pk-yrs)    Types: Cigarettes   Smokeless tobacco: Never  Vaping Use   Vaping status: Some Days  Substance and Sexual Activity   Alcohol use: No   Drug use: No   Sexual activity: Yes    Birth control/protection: I.U.D.  Other Topics Concern   Not on file  Social History Narrative   Not on file   Social Determinants of Health   Financial Resource Strain: Not on file  Food Insecurity: Not on file  Transportation Needs: Not on file  Physical Activity: Not on file  Stress: Not on file  Social Connections: Not on file  Intimate Partner Violence: Not on file    Review of Systems: See HPI, otherwise negative  ROS  Physical Exam: BP 105/79   Pulse 66   Temp (!) 97.2 F (36.2 C) (Temporal)   Resp 16   Ht 5\' 7"  (1.702 m)   Wt 159 lb 12.8 oz (72.5 kg)   SpO2 100%   BMI 25.03 kg/m  General:   Alert,  pleasant and cooperative in NAD Head:  Normocephalic and atraumatic. Neck:  Supple; no masses or thyromegaly. Lungs:  Clear throughout to auscultation.    Heart:  Regular rate and rhythm. Abdomen:  Soft, nontender and nondistended. Normal bowel sounds, without guarding, and without rebound.   Neurologic:  Alert and  oriented x4;  grossly normal neurologically.  Impression/Plan: Felicia [name] is here for an colonoscopy to be performed for h/o colon adenoma  Risks, benefits, limitations, and alternatives regarding  colonoscopy have been reviewed with the patient.  Questions have been answered.  All parties agreeable.   Felicia Donath, MD  08/24/2023, 10:14 AM

## 2023-08-24 NOTE — Anesthesia Procedure Notes (Signed)
Procedure Name: MAC Date/Time: 08/24/2023 8:51 AM  Performed by: Cheral Bay, CRNAPre-anesthesia Checklist: Patient identified, Emergency Drugs available, Suction available, Patient being monitored and Timeout performed Patient Re-evaluated:Patient Re-evaluated prior to induction Oxygen Delivery Method: Nasal cannula Induction Type: IV induction Placement Confirmation: positive ETCO2 and CO2 detector

## 2023-08-24 NOTE — Anesthesia Preprocedure Evaluation (Signed)
Anesthesia Evaluation  Patient identified by MRN, date of birth, ID band Patient awake    Reviewed: Allergy & Precautions, H&P , NPO status , Patient's Chart, lab work & pertinent test results  History of Anesthesia Complications Negative for: history of anesthetic complications  Airway Mallampati: II  TM Distance: <3 FB     Dental  (+) Teeth Intact   Pulmonary asthma , neg sleep apnea, neg COPD, Current Smoker and Patient abstained from smoking.   breath sounds clear to auscultation       Cardiovascular (-) angina (-) Past MI and (-) Cardiac Stents negative cardio ROS (-) dysrhythmias  Rhythm:regular Rate:Normal     Neuro/Psych  Headaches PSYCHIATRIC DISORDERS Anxiety Depression       GI/Hepatic negative GI ROS, Neg liver ROS,,,  Endo/Other  negative endocrine ROS    Renal/GU      Musculoskeletal   Abdominal   Peds  Hematology negative hematology ROS (+)   Anesthesia Other Findings Past Medical History: No date: Anxiety No date: Fibromyalgia No date: Migraine No date: Panic disorder  Past Surgical History: No date: AUGMENTATION MAMMAPLASTY No date: CESAREAN SECTION  BMI    Body Mass Index: 21.14 kg/m      Reproductive/Obstetrics negative OB ROS                             Anesthesia Physical Anesthesia Plan  ASA: 2  Anesthesia Plan: General   Post-op Pain Management: Minimal or no pain anticipated   Induction: Intravenous  PONV Risk Score and Plan: 3 and Propofol infusion, TIVA and Ondansetron  Airway Management Planned: Nasal Cannula  Additional Equipment: None  Intra-op Plan:   Post-operative Plan:   Informed Consent: I have reviewed the patients History and Physical, chart, labs and discussed the procedure including the risks, benefits and alternatives for the proposed anesthesia with the patient or authorized representative who has indicated his/her  understanding and acceptance.     Dental advisory given  Plan Discussed with: CRNA and Surgeon  Anesthesia Plan Comments: (Discussed risks of anesthesia with patient, including possibility of difficulty with spontaneous ventilation under anesthesia necessitating airway intervention, PONV, and rare risks such as cardiac or respiratory or neurological events, and allergic reactions. Discussed the role of CRNA in patient's perioperative care. Patient understands.)       Anesthesia Quick Evaluation

## 2023-08-24 NOTE — Anesthesia Postprocedure Evaluation (Signed)
Anesthesia Post Note  Patient: Felicia [name]  Procedure(s) Performed: COLONOSCOPY WITH PROPOFOL POLYPECTOMY  Patient location during evaluation: Endoscopy Anesthesia Type: General Level of consciousness: awake and alert Pain management: pain level controlled Vital Signs Assessment: post-procedure vital signs reviewed and stable Respiratory status: spontaneous breathing, nonlabored ventilation, respiratory function stable and patient connected to nasal cannula oxygen Cardiovascular status: blood pressure returned to baseline and stable Postop Assessment: no apparent nausea or vomiting Anesthetic complications: no  No notable events documented.   Last Vitals:  Vitals:   08/24/23 0930 08/24/23 0940  BP: 113/86 105/79  Pulse: 65 66  Resp: 14 16  Temp:    SpO2: 99% 100%    Last Pain:  Vitals:   08/24/23 0940  TempSrc:   PainSc: 0-No pain                 Stephanie Coup

## 2023-08-25 ENCOUNTER — Encounter: Payer: Self-pay | Admitting: Gastroenterology

## 2023-08-29 ENCOUNTER — Encounter: Payer: Self-pay | Admitting: Gastroenterology

## 2023-08-29 ENCOUNTER — Ambulatory Visit (INDEPENDENT_AMBULATORY_CARE_PROVIDER_SITE_OTHER): Payer: Medicare HMO | Admitting: Acute Care

## 2023-08-29 ENCOUNTER — Encounter: Payer: Self-pay | Admitting: Acute Care

## 2023-08-29 DIAGNOSIS — Z87891 Personal history of nicotine dependence: Secondary | ICD-10-CM

## 2023-08-29 NOTE — Patient Instructions (Signed)
 Thank you for participating in the  Lung Cancer Screening Program. It was our pleasure to meet you today. We will call you with the results of your scan within the next few days. Your scan will be assigned a Lung RADS category score by the physicians reading the scans.  This Lung RADS score determines follow up scanning.  See below for description of categories, and follow up screening recommendations. We will be in touch to schedule your follow up screening annually or based on recommendations of our providers. We will fax a copy of your scan results to your Primary Care Physician, or the physician who referred you to the program, to ensure they have the results. Please call the office if you have any questions or concerns regarding your scanning experience or results.  Our office number is [phone]. Please speak with Abigail Miyamoto, RN., Karlton Lemon RN, or Pietro Cassis RN. They are  our Lung Cancer Screening RN.'s If They are unavailable when you call, Please leave a message on the voice mail. We will return your call at our earliest convenience.This voice mail is monitored several times a day.  Remember, if your scan is normal, we will scan you annually as long as you continue to meet the criteria for the program. (Age 54-80, Current smoker or smoker who has quit within the last 15 years). If you are a smoker, remember, quitting is the single most powerful action that you can take to decrease your risk of lung cancer and other pulmonary, breathing related problems. We know quitting is hard, and we are here to help.  Please let us know if there is anything we can do to help you meet your goal of quitting. If you are a former smoker, Counselling psychologist. We are proud of you! Remain smoke free! Remember you can refer friends or family members through the number above.  We will screen them to make sure they meet criteria for the program. Thank you for helping [place] take better care of you  by participating in Lung Screening.   Lung RADS Categories:  Lung RADS 1: no nodules or definitely non-concerning nodules.  Recommendation is for a repeat annual scan in 12 months.  Lung RADS 2:  nodules that are non-concerning in appearance and behavior with a very low likelihood of becoming an active cancer. Recommendation is for a repeat annual scan in 12 months.  Lung RADS 3: nodules that are probably non-concerning , includes nodules with a low likelihood of becoming an active cancer.  Recommendation is for a 40-month repeat screening scan. Often noted after an upper respiratory illness. We will be in touch to make sure you have no questions, and to schedule your 22-month scan.  Lung RADS 4 A: nodules with concerning findings, recommendation is most often for a follow up scan in 3 months or additional testing based on our provider's assessment of the scan. We will be in touch to make sure you have no questions and to schedule the recommended 3 month follow up scan.  Lung RADS 4 B:  indicates findings that are concerning. We will be in touch with you to schedule additional diagnostic testing based on our provider's  assessment of the scan.  You can receive free nicotine replacement therapy ( patches, gum or mints) by calling 1-800-QUIT NOW. Please call so we can get you on the path to becoming  a non-smoker. I know it is hard, but you can do this!  Other options for assistance in  smoking cessation ( As covered by your insurance benefits)  Hypnosis for smoking cessation  [employer] [phone]  Acupuncture for smoking cessation  [employer] [phone]

## 2023-08-29 NOTE — Progress Notes (Signed)
Virtual Visit via Telephone Note  I connected with Felicia [name] on 08/29/23 at  4:00 PM EDT by telephone and verified that I am speaking with the correct person using two identifiers.  Location: Patient:  At home Provider: [address] [place], [place], [address]    I discussed the limitations, risks, security and privacy concerns of performing an evaluation and management service by telephone and the availability of in person appointments. I also discussed with the patient that there may be a patient responsible charge related to this service. The patient expressed understanding and agreed to proceed.    Shared Decision Making Visit Lung Cancer Screening Program [phone])   Eligibility: Age 52 y.o. Pack Years Smoking History Calculation 72 pack year smoking history (# packs/per year x # years smoked) Recent History of coughing up blood  no Unexplained weight loss? no ( >Than 15 pounds within the last 6 months ) Prior History Lung / other cancer no (Diagnosis within the last 5 years already requiring surveillance chest CT Scans). Smoking Status Former Smoker Former Smokers: Years since quit:  NA  Quit Date:  NA  Visit Components: Discussion included one or more decision making aids. yes Discussion included risk/benefits of screening. yes Discussion included potential follow up diagnostic testing for abnormal scans. yes Discussion included meaning and risk of over diagnosis. yes Discussion included meaning and risk of False Positives. yes Discussion included meaning of total radiation exposure. yes  Counseling Included: Importance of adherence to annual lung cancer LDCT screening. yes Impact of comorbidities on ability to participate in the program. yes Ability and willingness to under diagnostic treatment. yes  Smoking Cessation Counseling: Current Smokers:  Discussed importance of smoking cessation. yes Information about tobacco cessation classes and interventions  provided to patient. yes Patient provided with "ticket" for LDCT Scan.  NA Symptomatic Patient. no  Counseling NA Diagnosis Code: Tobacco Use Z72.0 Asymptomatic Patient yes  Counseling (Intermediate counseling: > three minutes counseling) H8469 Former Smokers:  Discussed the importance of maintaining cigarette abstinence. yes Diagnosis Code: Personal History of Nicotine Dependence. G29.528 Information about tobacco cessation classes and interventions provided to patient. Yes Patient provided with "ticket" for LDCT Scan. yes Written Order for Lung Cancer Screening with LDCT placed in Epic. Yes (CT Chest Lung Cancer Screening Low Dose W/O CM) [id number] Z12.2-Screening of respiratory organs Z87.891-Personal history of nicotine dependence  I spent 25 minutes of face to face time/virtual visit time  with Felicia [name] discussing the risks and benefits of lung cancer screening. We took the time to pause the power point at intervals to allow for questions to be asked and answered to ensure understanding. We discussed that she had taken the single most powerful action possible to decrease her risk of developing lung cancer when she quit smoking. I counseled her to remain smoke free, and to contact me if she ever had the desire to smoke again so that I can provide resources and tools to help support the effort to remain smoke free. We discussed the time and location of the scan, and that either  Felicia Miyamoto RN, Felicia Lemon, RN or I  or I will call / send a letter with the results within  24-72 hours of receiving them. She has the office contact information in the event she needs to speak with me,  she verbalized understanding of all of the above and had no further questions upon leaving the office.     I explained to the patient that  there has been a high incidence of coronary artery disease noted on these exams. I explained that this is a non-gated exam therefore degree or severity cannot be determined.  This patient is not on statin therapy. I have asked the patient to follow-up with their PCP regarding any incidental finding of coronary artery disease and management with diet or medication as they feel is clinically indicated. The patient verbalized understanding of the above and had no further questions.     Bevelyn Ngo, NP 08/29/2023

## 2023-08-30 ENCOUNTER — Ambulatory Visit: Payer: Medicare HMO

## 2023-08-30 ENCOUNTER — Other Ambulatory Visit: Payer: Self-pay | Admitting: Acute Care

## 2023-08-30 ENCOUNTER — Other Ambulatory Visit: Payer: Medicare HMO

## 2023-08-30 ENCOUNTER — Ambulatory Visit
Admission: RE | Admit: 2023-08-30 | Discharge: 2023-08-30 | Disposition: A | Discharge status: Home / Self Care | Payer: Medicare HMO | Source: Ambulatory Visit | Attending: Acute Care | Admitting: Acute Care

## 2023-08-30 DIAGNOSIS — I2510 Atherosclerotic heart disease of native coronary artery without angina pectoris: Secondary | ICD-10-CM | POA: Diagnosis not present

## 2023-08-30 DIAGNOSIS — Z122 Encounter for screening for malignant neoplasm of respiratory organs: Secondary | ICD-10-CM

## 2023-08-30 DIAGNOSIS — Z87891 Personal history of nicotine dependence: Secondary | ICD-10-CM | POA: Diagnosis not present

## 2023-08-30 DIAGNOSIS — J439 Emphysema, unspecified: Secondary | ICD-10-CM | POA: Diagnosis not present

## 2023-09-08 ENCOUNTER — Other Ambulatory Visit: Payer: Self-pay | Admitting: Acute Care

## 2023-09-08 DIAGNOSIS — Z122 Encounter for screening for malignant neoplasm of respiratory organs: Secondary | ICD-10-CM

## 2023-09-08 DIAGNOSIS — Z87891 Personal history of nicotine dependence: Secondary | ICD-10-CM

## 2023-12-25 DIAGNOSIS — F419 Anxiety disorder, unspecified: Secondary | ICD-10-CM | POA: Diagnosis not present

## 2023-12-25 DIAGNOSIS — F330 Major depressive disorder, recurrent, mild: Secondary | ICD-10-CM | POA: Diagnosis not present

## 2023-12-25 DIAGNOSIS — G43909 Migraine, unspecified, not intractable, without status migrainosus: Secondary | ICD-10-CM | POA: Diagnosis not present

## 2024-01-10 DIAGNOSIS — R0602 Shortness of breath: Secondary | ICD-10-CM | POA: Diagnosis not present

## 2024-01-10 DIAGNOSIS — J4489 Other specified chronic obstructive pulmonary disease: Secondary | ICD-10-CM | POA: Diagnosis not present

## 2024-01-15 DIAGNOSIS — R0602 Shortness of breath: Secondary | ICD-10-CM | POA: Diagnosis not present

## 2024-07-03 ENCOUNTER — Telehealth: Payer: Self-pay

## 2024-07-03 NOTE — Telephone Encounter (Signed)
 Ok for E2C2 to review.  Please assist patient and let her know that as previously told we can not prescribe any medications until her new patient appointment with [facility] is completed. While her old office did close down (Dr. Corlis) and we have her scheduled as a new patient there are providers at [facility] [phone]. If patient needs her medications it may be in her best interest to consider one of the other local providers with openings sooner. Otherwise she is already on the wait list for call if we have openings sooner but at this time [facility] has no options without her new patient appointment being completed.

## 2024-07-03 NOTE — Telephone Encounter (Signed)
 Copied from CRM [id number]. Topic: General - Call Back - No Documentation >> Jul 01, 2024 10:49 AM Avram MATSU wrote: Reason for CRM: patient has a few questions about getting a medication refilled. I informed her she needs to see her provider first before putting in that request. She stated she was told otherwise by her pharmacy and last provider.[phone] (M) okay to leave vm

## 2024-08-06 ENCOUNTER — Ambulatory Visit (INDEPENDENT_AMBULATORY_CARE_PROVIDER_SITE_OTHER): Admitting: Pediatrics

## 2024-08-06 ENCOUNTER — Telehealth: Payer: Self-pay

## 2024-08-06 ENCOUNTER — Encounter: Payer: Self-pay | Admitting: Pediatrics

## 2024-08-06 VITALS — BP 109/74 | HR 72 | Temp 97.4°F | Ht 64.0 in | Wt 154.3 lb

## 2024-08-06 DIAGNOSIS — F1190 Opioid use, unspecified, uncomplicated: Secondary | ICD-10-CM | POA: Diagnosis not present

## 2024-08-06 DIAGNOSIS — F3341 Major depressive disorder, recurrent, in partial remission: Secondary | ICD-10-CM

## 2024-08-06 DIAGNOSIS — Z1330 Encounter for screening examination for mental health and behavioral disorders, unspecified: Secondary | ICD-10-CM

## 2024-08-06 DIAGNOSIS — Z79899 Other long term (current) drug therapy: Secondary | ICD-10-CM

## 2024-08-06 DIAGNOSIS — F1320 Sedative, hypnotic or anxiolytic dependence, uncomplicated: Secondary | ICD-10-CM

## 2024-08-06 DIAGNOSIS — M797 Fibromyalgia: Secondary | ICD-10-CM | POA: Diagnosis not present

## 2024-08-06 DIAGNOSIS — J45909 Unspecified asthma, uncomplicated: Secondary | ICD-10-CM | POA: Diagnosis not present

## 2024-08-06 DIAGNOSIS — M539 Dorsopathy, unspecified: Secondary | ICD-10-CM | POA: Insufficient documentation

## 2024-08-06 DIAGNOSIS — Z7689 Persons encountering health services in other specified circumstances: Secondary | ICD-10-CM

## 2024-08-06 MED ORDER — MORPHINE SULFATE 30 MG PO TABS
ORAL_TABLET | ORAL | 0 refills | Status: DC
Start: 2024-08-06 — End: 2024-08-07

## 2024-08-06 MED ORDER — REXULTI 0.5 MG PO TABS
0.5000 mg | ORAL_TABLET | Freq: Every day | ORAL | 0 refills | Status: DC
Start: 2024-08-06 — End: 2024-08-20

## 2024-08-06 MED ORDER — CARIPRAZINE HCL 1.5 MG PO CAPS: 1.5000 mg | ORAL_CAPSULE | Freq: Every day | ORAL | 1 refills | Status: DC

## 2024-08-06 NOTE — Telephone Encounter (Signed)
 Copied from CRM [id number]. Topic: Clinical - Prescription Issue >> Aug 06, 2024  4:31 PM Antwanette L wrote: Reason for CRM: Mandy from [facility] is calling because they cannot prescribe 14 tablets of. Brexpiprazole  (REXULTI ) 0.5 MG TABS. Please sent a new order of 30 tablets. Also the  patient gets extended release of morphine  (MSIR) 30 MG tablet  Please call [facility] at [phone] and ask for [facility]

## 2024-08-06 NOTE — Patient Instructions (Addendum)
 Start taking half of rexult 2mg  tab = 1mg  tab for 7 days Then, start 0.5mg  for another 7 days then stop  If going well you can start vraylar  1.5mg  daily  Good to meet you! Welcome to [facility]!  As your primary care doctor, I look forward to working with you to help you reach your health goals.  Please be aware of a couple of logistical items: - If you message me on mychart, it may take me 1-2 business days to get back to you. This is for non-urgent messaging.  - If you require urgent clinical attention, please call the clinic or present to urgent care/emergency room - If you have labs, I typically will send a message about them in 1-2 business days. - I am not here on Mondays, otherwise will be available from Tuesday-Friday during 8a-5pm.

## 2024-08-06 NOTE — Progress Notes (Signed)
 Establish Care Note  BP 109/74   Pulse 72   Temp (!) 97.4 F (36.3 C) (Oral)   Ht 5' 4 (1.626 m)   Wt 154 lb 4.8 oz (70 kg)   LMP 10/10/2018   SpO2 98%   BMI 26.49 kg/m    Subjective:    Patient ID: Felicia [name], female    DOB: 1971/06/06, 53 y.o.   MRN: [id number]  HPI: Felicia [name] is a 53 y.o. female  Chief Complaint  Patient presents with   Establish Care    Establishing care, the following was discussed today:  Discussed the use of AI scribe software for clinical note transcription with the patient, who gave verbal consent to proceed.  History of Present Illness   Felicia [name] is a 53 year old female with fibromyalgia, depression, and anxiety who presents for medication management and follow-up.  She reports that she has been taking Rexulti  for depression and anxiety. Initially, Rexulti  was highly effective, but its effects diminished after almost a year. An increase to 2 mg did not improve her symptoms. She experiences severe depression and anxiety but describes herself as a 'functioning' individual, able to fulfill obligations despite her condition.  She has been on Effexor for a long time, taking different doses in the morning and at night. She is open to trying new antidepressants as it has been a long time since any changes were made to her regimen.  She reports running out of morphine , which she prefers to fill at [facility]. She has been managing her medication refills by coordinating with her previous provider's assistant monthly due to the controlled nature of the medication.  She experiences vivid dreams and nightmares, which she associates with the increased dose of Rexulti . To manage this, she has adjusted the timing of her dose to the morning.  For sleep, she takes trazodone, but feels it does not help her during the night and may contribute to her sleeping in the morning. She also takes medication for anxiety at night.  She uses a Trelegy  inhaler daily and takes Singulair for chronic asthmatic bronchitis, previously thought to be borderline COPD. She did not bring her inhaler to the visit but confirmed its use.        Current Outpatient Medications on File Prior to Visit  Medication Sig Dispense Refill   albuterol (PROVENTIL HFA;VENTOLIN HFA) 108 (90 Base) MCG/ACT inhaler Inhale into the lungs.     ALPRAZolam (XANAX) 1 MG tablet Take 1 mg by mouth 3 (three) times daily as needed.     butalbital -acetaminophen -caffeine  (FIORICET, ESGIC) 50-325-40 MG tablet Take 1 tablet by mouth every 6 (six) hours as needed for headache.      conjugated estrogens  (PREMARIN ) vaginal cream Place 0.25 Applicatorfuls vaginally 2 (two) times a week. 42.5 g 12   Fluticasone-Umeclidin-Vilant (TRELEGY ELLIPTA) 100-62.5-25 MCG/ACT AEPB Inhale 1 puff into the lungs daily.     furosemide (LASIX) 20 MG tablet Take 20 mg by mouth daily as needed for fluid.     ibuprofen  (ADVIL ) 800 MG tablet Take 800 mg by mouth 3 (three) times daily.     montelukast (SINGULAIR) 10 MG tablet Take 10 mg by mouth daily.     pregabalin (LYRICA) 150 MG capsule Take 150 mg by mouth 2 (two) times daily.     traZODone (DESYREL) 100 MG tablet Take 100 mg by mouth at bedtime.     venlafaxine XR (EFFEXOR-XR) 75 MG 24 hr capsule Take  75 mg by mouth daily with breakfast.     ibuprofen  (ADVIL ) 200 MG tablet Take 200 mg by mouth every 6 (six) hours as needed. (Patient not taking: Reported on 08/06/2024)     No current facility-administered medications on file prior to visit.    #HM Will review HM records and updated as needed.  Relevant past medical, surgical, family and social history reviewed and updated as indicated. Interim medical history since our last visit reviewed. Allergies and medications reviewed and updated.  ROS per HPI unless specifically indicated above     Objective:    BP 109/74   Pulse 72   Temp (!) 97.4 F (36.3 C) (Oral)   Ht 5' 4 (1.626 m)   Wt 154 lb  4.8 oz (70 kg)   LMP 10/10/2018   SpO2 98%   BMI 26.49 kg/m   Wt Readings from Last 3 Encounters:  08/06/24 154 lb 4.8 oz (70 kg)  08/24/23 159 lb 12.8 oz (72.5 kg)  02/01/23 150 lb 8 oz (68.3 kg)     Physical Exam Constitutional:      Appearance: Normal appearance.  Pulmonary:     Effort: Pulmonary effort is normal.  Musculoskeletal:        General: Normal range of motion.  Skin:    Comments: Normal skin color  Neurological:     General: No focal deficit present.     Mental Status: She is alert. Mental status is at baseline.  Psychiatric:        Mood and Affect: Mood normal.        Behavior: Behavior normal.        Thought Content: Thought content normal.         08/06/2024    3:15 PM 07/02/2020    2:00 PM  Depression screen PHQ 2/9  Decreased Interest 1 1  Down, Depressed, Hopeless 0 1  PHQ - 2 Score 1 2  Altered sleeping 1 1  Tired, decreased energy 0 3  Change in appetite 0 2  Feeling bad or failure about yourself  0 2  Trouble concentrating 1 3  Moving slowly or fidgety/restless 0 1  Suicidal thoughts 0 0  PHQ-9 Score 3 14  Difficult doing work/chores Not difficult at all         08/06/2024    3:15 PM 07/02/2020    2:01 PM  GAD 7 : Generalized Anxiety Score  Nervous, Anxious, on Edge 1 2  Control/stop worrying 1 3  Worry too much - different things 1 3  Trouble relaxing 1 3  Restless 0 2  Easily annoyed or irritable 0 2  Afraid - awful might happen 0 0  Total GAD 7 Score 4 15  Anxiety Difficulty Not difficult at all Very difficult       Assessment & Plan:  Assessment & Plan   Recurrent major depressive disorder, in partial remission (HCC) Assessment & Plan: Chronic depression and anxiety with severe symptoms. Rexulti  lost efficacy despite increased dose. Considering Vraylar  for better efficacy and similar side effect profile. - Discontinue Rexulti  with taper: 1 mg for one week, then 0.5 mg for another week. - Initiate Vraylar  post-Rexulti   taper. - Schedule follow-up in 5-6 weeks to assess Vraylar  response. - Consider genetic swab if Vraylar  ineffective.  Orders: -     Cariprazine  HCl; Take 1 capsule (1.5 mg total) by mouth daily.  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 Has been on same dose and wants to continue. Discussed benzo precautions and risks for adverse events in older adults. He understands risks and wants to continue his current regiment. Will re-visit long term plan given on multiple controlled substances and against our clinic policy.   Fibromyalgia Assessment & Plan: Chronic pain managed with morphine . Discussed electronic prescription process for streamlined refills. - Send electronic prescription for morphine  to [facility]. - Ensure pharmacy aware of new provider for seamless transition.  Orders: -     Morphine  Sulfate ER; Two pills in the morning, one at night  Dispense: 84 tablet; Refill: 0  Uncomplicated asthma, unspecified asthma severity, unspecified whether persistent Assessment & Plan: Chronic asthmatic bronchitis well-controlled with Trelegy inhaler and Singulair. - Continue Trelegy inhaler daily. - Continue Singulair as prescribed.    Encounter to establish care Reviewed available patient record including history, medications, problem list. HM updated as able. Will review and/or request outside records (if applicable) and will fill remaining HM gaps as needed at follow up visit.  Encounter for behavioral health screening As part of their intake evaluation, the patient was screened for depression, anxiety.  PHQ9 SCORE 3, GAD7 SCORE 4. Screening results negative for tested conditions. See plan under problem/diagnosis above.    Follow up plan: Return in about 4 weeks (around 09/03/2024).  Hadassah SHAUNNA Nett, MD

## 2024-08-07 ENCOUNTER — Other Ambulatory Visit: Payer: Self-pay | Admitting: Pediatrics

## 2024-08-07 DIAGNOSIS — F3341 Major depressive disorder, recurrent, in partial remission: Secondary | ICD-10-CM

## 2024-08-07 MED ORDER — MORPHINE SULFATE ER 30 MG PO TBCR
EXTENDED_RELEASE_TABLET | ORAL | 0 refills | Status: DC
Start: 2024-08-07 — End: 2024-09-06

## 2024-08-07 MED ORDER — REXULTI 0.5 MG PO TABS: 0.5000 mg | ORAL_TABLET | Freq: Every day | ORAL | 0 refills | Status: DC

## 2024-08-08 ENCOUNTER — Telehealth: Payer: Self-pay

## 2024-08-08 ENCOUNTER — Other Ambulatory Visit (HOSPITAL_COMMUNITY): Payer: Self-pay

## 2024-08-08 NOTE — Telephone Encounter (Signed)
 Pharmacy Patient Advocate Encounter   Received notification from CoverMyMeds that prior authorization for Vraylar  1.5MG  capsules is required/requested.   Insurance verification completed.   The patient is insured through [facility] .   Per test claim: Refill too soon. PA is not needed at this time. Medication was filled 08/06/2024. Next eligible fill date is 08/29/2024.

## 2024-08-09 ENCOUNTER — Other Ambulatory Visit (HOSPITAL_COMMUNITY): Payer: Self-pay

## 2024-08-12 ENCOUNTER — Other Ambulatory Visit (HOSPITAL_COMMUNITY): Payer: Self-pay

## 2024-08-16 ENCOUNTER — Other Ambulatory Visit: Payer: Self-pay | Admitting: Pediatrics

## 2024-08-16 NOTE — Telephone Encounter (Signed)
 Copied from CRM [id number]. Topic: Clinical - Medication Refill >> Aug 16, 2024 10:44 AM Marissa P wrote: Medication: ibuprofen  (ADVIL ) 800 MG tablet  Has the patient contacted their pharmacy? Yes (Agent: If no, request that the patient contact the pharmacy for the refill. If patient does not wish to contact the pharmacy document the reason why and proceed with request.) (Agent: If yes, when and what did the pharmacy advise?)  This is the patient's preferred pharmacy:  [facility] - [place], [place] - [address] [place] [address] Phone: [phone] Fax: [phone]  Is this the correct pharmacy for this prescription? Yes If no, delete pharmacy and type the correct one.   Has the prescription been filled recently? Yes  Is the patient out of the medication? Yes  Has the patient been seen for an appointment in the last year OR does the patient have an upcoming appointment? Yes  Can we respond through MyChart? Yes  Agent: Please be advised that Rx refills may take up to 3 business days. We ask that you follow-up with your pharmacy.

## 2024-08-18 ENCOUNTER — Encounter: Payer: Self-pay | Admitting: Pediatrics

## 2024-08-18 DIAGNOSIS — J45909 Unspecified asthma, uncomplicated: Secondary | ICD-10-CM | POA: Insufficient documentation

## 2024-08-18 NOTE — Assessment & Plan Note (Signed)
 Chronic depression and anxiety with severe symptoms. Rexulti  lost efficacy despite increased dose. Considering Vraylar  for better efficacy and similar side effect profile. - Discontinue Rexulti  with taper: 1 mg for one week, then 0.5 mg for another week. - Initiate Vraylar  post-Rexulti  taper. - Schedule follow-up in 5-6 weeks to assess Vraylar  response. - Consider genetic swab if Vraylar  ineffective.

## 2024-08-18 NOTE — Assessment & Plan Note (Signed)
 Chronic pain managed with morphine . Discussed electronic prescription process for streamlined refills. - Send electronic prescription for morphine  to [facility]. - Ensure pharmacy aware of new provider for seamless transition.

## 2024-08-18 NOTE — Assessment & Plan Note (Signed)
 Chronic asthmatic bronchitis well-controlled with Trelegy inhaler and Singulair. - Continue Trelegy inhaler daily. - Continue Singulair as prescribed.

## 2024-08-18 NOTE — Assessment & Plan Note (Addendum)
 On alprazolam 1mg  TID. Has been on same dose and wants to continue. Discussed benzo precautions and risks for adverse events in older adults. He understands risks and wants to continue his current regiment. Will re-visit long term plan given on multiple controlled substances and against our clinic policy.

## 2024-08-19 ENCOUNTER — Other Ambulatory Visit: Payer: Self-pay

## 2024-08-19 MED ORDER — BUTALBITAL-APAP-CAFFEINE 50-325-40 MG PO TABS
1.0000 | ORAL_TABLET | Freq: Four times a day (QID) | ORAL | 0 refills | Status: DC | PRN
Start: 2024-08-19 — End: 2024-09-20

## 2024-08-19 MED ORDER — IBUPROFEN 800 MG PO TABS: 800.0000 mg | ORAL_TABLET | Freq: Three times a day (TID) | ORAL | 2 refills | Status: AC

## 2024-08-19 NOTE — Telephone Encounter (Signed)
 Requested medication (s) are due for refill today - unsure  Requested medication (s) are on the active medication list -yes  Future visit scheduled -yes  Last refill: 06/26/24  Notes to clinic: listed as historical, fails lab protocol- over 1 year-10/31/2018  Requested Prescriptions  Pending Prescriptions Disp Refills   ibuprofen  (ADVIL ) 800 MG tablet 30 tablet     Sig: Take 1 tablet (800 mg total) by mouth 3 (three) times daily.     Analgesics:  NSAIDS Failed - 08/19/2024 10:19 AM      Failed - Manual Review: Labs are only required if the patient has taken medication for more than 8 weeks.      Failed - Cr in normal range and within 360 days    Creatinine, Ser  Date Value Ref Range Status  10/31/2018 0.53 0.44 - 1.00 mg/dL Final         Failed - HGB in normal range and within 360 days    Hemoglobin  Date Value Ref Range Status  10/31/2018 12.4 12.0 - 15.0 g/dL Final         Failed - PLT in normal range and within 360 days    Platelets  Date Value Ref Range Status  10/31/2018 254 150 - 400 K/uL Final         Failed - HCT in normal range and within 360 days    HCT  Date Value Ref Range Status  10/31/2018 37.4 36.0 - 46.0 % Final         Failed - eGFR is 30 or above and within 360 days    GFR calc Af Amer  Date Value Ref Range Status  10/31/2018 >60 >60 mL/min Final    Comment:    (NOTE) The eGFR has been calculated using the CKD EPI equation. This calculation has not been validated in all clinical situations. eGFR's persistently <60 mL/min signify possible Chronic Kidney Disease.    GFR calc non Af Amer  Date Value Ref Range Status  10/31/2018 >60 >60 mL/min Final         Passed - Patient is not pregnant      Passed - Valid encounter within last 12 months    Recent Outpatient Visits           1 week ago Recurrent major depressive disorder, in partial remission [facility])   [facility] Hadassah SQUIBB, MD                  Requested Prescriptions  Pending Prescriptions Disp Refills   ibuprofen  (ADVIL ) 800 MG tablet 30 tablet     Sig: Take 1 tablet (800 mg total) by mouth 3 (three) times daily.     Analgesics:  NSAIDS Failed - 08/19/2024 10:19 AM      Failed - Manual Review: Labs are only required if the patient has taken medication for more than 8 weeks.      Failed - Cr in normal range and within 360 days    Creatinine, Ser  Date Value Ref Range Status  10/31/2018 0.53 0.44 - 1.00 mg/dL Final         Failed - HGB in normal range and within 360 days    Hemoglobin  Date Value Ref Range Status  10/31/2018 12.4 12.0 - 15.0 g/dL Final         Failed - PLT in normal range and within 360 days    Platelets  Date Value Ref Range Status  10/31/2018 254 150 - 400 K/uL Final         Failed - HCT in normal range and within 360 days    HCT  Date Value Ref Range Status  10/31/2018 37.4 36.0 - 46.0 % Final         Failed - eGFR is 30 or above and within 360 days    GFR calc Af Amer  Date Value Ref Range Status  10/31/2018 >60 >60 mL/min Final    Comment:    (NOTE) The eGFR has been calculated using the CKD EPI equation. This calculation has not been validated in all clinical situations. eGFR's persistently <60 mL/min signify possible Chronic Kidney Disease.    GFR calc non Af Amer  Date Value Ref Range Status  10/31/2018 >60 >60 mL/min Final         Passed - Patient is not pregnant      Passed - Valid encounter within last 12 months    Recent Outpatient Visits           1 week ago Recurrent major depressive disorder, in partial remission [facility])   [facility] Hadassah SQUIBB, MD

## 2024-08-19 NOTE — Telephone Encounter (Signed)
 Pharmacy is requesting refills on Butalbital -Apap-caffein please advise?

## 2024-09-02 ENCOUNTER — Ambulatory Visit
Admission: RE | Admit: 2024-09-02 | Discharge: 2024-09-02 | Disposition: A | Discharge status: Home / Self Care | Source: Ambulatory Visit | Attending: Acute Care | Admitting: Acute Care

## 2024-09-02 DIAGNOSIS — Z87891 Personal history of nicotine dependence: Secondary | ICD-10-CM

## 2024-09-02 DIAGNOSIS — Z122 Encounter for screening for malignant neoplasm of respiratory organs: Secondary | ICD-10-CM

## 2024-09-03 ENCOUNTER — Other Ambulatory Visit: Payer: Self-pay | Admitting: Pediatrics

## 2024-09-03 DIAGNOSIS — M797 Fibromyalgia: Secondary | ICD-10-CM

## 2024-09-04 NOTE — Telephone Encounter (Signed)
 Requested medication (s) are due for refill today:   Provider to review  Requested medication (s) are on the active medication list:   Yes  Future visit scheduled:   Yes   Last ordered: 08/07/2024 #84, 0 refills  Non delegated refill    Requested Prescriptions  Pending Prescriptions Disp Refills   morphine  (MS CONTIN ) 30 MG 12 hr tablet [Pharmacy Med Name: MORPHINE  SULFATE ER 30 MG TAB] 84 tablet     Sig: TAKE 2 TABLETS BY MOUTH ONCE EVERY MORNING AND 1 TABLET AT BEDTIME     Not Delegated - Analgesics:  Opioid Agonists Failed - 09/04/2024 11:58 AM      Failed - This refill cannot be delegated      Failed - Urine Drug Screen completed in last 360 days      Passed - Valid encounter within last 3 months    Recent Outpatient Visits           4 weeks ago Recurrent major depressive disorder, in partial remission [facility])   [facility] Hadassah SQUIBB, MD

## 2024-09-05 ENCOUNTER — Ambulatory Visit (INDEPENDENT_AMBULATORY_CARE_PROVIDER_SITE_OTHER): Admitting: Pediatrics

## 2024-09-05 VITALS — BP 100/67 | HR 85 | Temp 97.6°F | Ht 64.0 in | Wt 157.8 lb

## 2024-09-05 DIAGNOSIS — Z23 Encounter for immunization: Secondary | ICD-10-CM

## 2024-09-05 DIAGNOSIS — F1320 Sedative, hypnotic or anxiolytic dependence, uncomplicated: Secondary | ICD-10-CM | POA: Diagnosis not present

## 2024-09-05 DIAGNOSIS — F3341 Major depressive disorder, recurrent, in partial remission: Secondary | ICD-10-CM | POA: Diagnosis not present

## 2024-09-05 DIAGNOSIS — M797 Fibromyalgia: Secondary | ICD-10-CM | POA: Diagnosis not present

## 2024-09-05 DIAGNOSIS — F1190 Opioid use, unspecified, uncomplicated: Secondary | ICD-10-CM | POA: Diagnosis not present

## 2024-09-05 DIAGNOSIS — Z79899 Other long term (current) drug therapy: Secondary | ICD-10-CM | POA: Diagnosis not present

## 2024-09-05 MED ORDER — CARIPRAZINE HCL 3 MG PO CAPS: 3.0000 mg | ORAL_CAPSULE | Freq: Every day | ORAL | 1 refills | Status: DC

## 2024-09-05 NOTE — Progress Notes (Signed)
 Office Visit  BP 100/67 (BP Location: Left Arm, Patient Position: Sitting, Cuff Size: Large)   Pulse 85   Temp 97.6 F (36.4 C)   Ht 5' 4 (1.626 m)   Wt 157 lb 12.8 oz (71.6 kg)   LMP 10/10/2018   SpO2 98%   BMI 27.09 kg/m    Subjective:    Patient ID: Felicia [name], female    DOB: 28-May-1971, 53 y.o.   MRN: [id number]  HPI: Felicia [name] is a 53 y.o. female  Chief Complaint  Patient presents with   Fibromyalgia    Pt presents today for a 4 week f/u for fibromyalgia and depression.   Depression    Discussed the use of AI scribe software for clinical note transcription with the patient, who gave verbal consent to proceed.  History of Present Illness   Felicia [name] is a 53 year old female with depression and anxiety who presents for medication management.  She has transitioned from Rexulti  to Vraylar , currently taking 1.5 mg, and notes a slight benefit. She believes a higher dose might be more effective, having tried many medications in the past in search of a better fit.  She is not taking Lasix (furosemide) regularly, although she still has some available from when she used to take it as needed.  Her morphine  prescription, which is a 28-day supply, has not been ready for pickup despite being sent automatically. She has reminders set for her medications and has been in contact with the pharmacy regarding this issue. Her Xanax prescription is being managed without issues.  She has a history of depression and anxiety since childhood and has not found psychiatry beneficial in the past. She is familiar with what treatments work for her conditions.  Her fibromyalgia symptoms tend to worsen in the winter months, impacting her ability to attend appointments, as seen with a missed dentist appointment due to snow.        Relevant past medical, surgical, family and social history reviewed and updated as indicated. Interim medical history since our last visit  reviewed. Allergies and medications reviewed and updated.  ROS per HPI unless specifically indicated above     Objective:    BP 100/67 (BP Location: Left Arm, Patient Position: Sitting, Cuff Size: Large)   Pulse 85   Temp 97.6 F (36.4 C)   Ht 5' 4 (1.626 m)   Wt 157 lb 12.8 oz (71.6 kg)   LMP 10/10/2018   SpO2 98%   BMI 27.09 kg/m   Wt Readings from Last 3 Encounters:  09/05/24 157 lb 12.8 oz (71.6 kg)  08/06/24 154 lb 4.8 oz (70 kg)  08/24/23 159 lb 12.8 oz (72.5 kg)     Physical Exam Constitutional:      Appearance: Normal appearance.  Pulmonary:     Effort: Pulmonary effort is normal.  Musculoskeletal:        General: Normal range of motion.  Skin:    Comments: Normal skin color  Neurological:     General: No focal deficit present.     Mental Status: She is alert. Mental status is at baseline.  Psychiatric:        Mood and Affect: Mood normal.        Behavior: Behavior normal.        Thought Content: Thought content normal.         09/05/2024   11:03 AM 08/06/2024    3:15 PM 07/02/2020    2:00 PM  Depression screen PHQ 2/9  Decreased Interest 1 1 1   Down, Depressed, Hopeless 1 0 1  PHQ - 2 Score 2 1 2   Altered sleeping 1 1 1   Tired, decreased energy 2 0 3  Change in appetite 0 0 2  Feeling bad or failure about yourself  3 0 2  Trouble concentrating 1 1 3   Moving slowly or fidgety/restless 0 0 1  Suicidal thoughts 0 0 0  PHQ-9 Score 9 3 14   Difficult doing work/chores  Not difficult at all        09/05/2024   11:04 AM 08/06/2024    3:15 PM 07/02/2020    2:01 PM  GAD 7 : Generalized Anxiety Score  Nervous, Anxious, on Edge 1 1 2   Control/stop worrying 3 1 3   Worry too much - different things 3 1 3   Trouble relaxing 1 1 3   Restless 0 0 2  Easily annoyed or irritable 0 0 2  Afraid - awful might happen 0 0 0  Total GAD 7 Score 8 4 15   Anxiety Difficulty  Not difficult at all Very difficult       Assessment & Plan:  Assessment & Plan    Recurrent major depressive disorder, in partial remission Assessment & Plan: Transition from Rexulti  to Vraylar  was smooth. Current dose of Vraylar  is 1.5 mg, with some benefit noted. She believes a higher dose may be more effective. - Increase Vraylar  dose to 3 mg and monitor response. - Provide prescription for Vraylar  with titration packs up to 4.5 mg. - Instruct her to call if 3 mg is insufficient, as prescription allows titration up to 6 mg. - Discuss potential referral to psychiatry for a second opinion if desired. - Continue other agents: venlafaxine 75mg  daily  Orders: -     Cariprazine  HCl; Take 1 capsule (3 mg total) by mouth daily.  Dispense: 30 capsule; Refill: 1  Fibromyalgia Chronic, continuous use of opioids Assessment & Plan: She is on a 28-day supply of morphine  which I am concerned about given chronic benzo use as well. There is an issue with the pharmacy not having the order, possibly due to the high dose being flagged in the system. 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 Has been on this as steady dose for years so concerned about withdrawal potential. Consider psych referral if amenable to make safer long term plan. While she has been on this regimen for years, I am concerned about worsened sedation as she ages. Needs controlled substance agreement signed.  Need for vaccination -     Flu vaccine trivalent PF, 6mos and older(Flulaval,Afluria,Fluarix,Fluzone) -     COVID-19 mRNA Vac-TriS(Pfizer); Inject 0.3 mLs into the muscle once for 1 dose.  Dispense: 0.3 mL; Refill: 0   Follow up plan: Return in about 4 months (around 01/05/2025) for Chronic illness f/u.  Felicia SHAUNNA Nett, MD

## 2024-09-06 ENCOUNTER — Other Ambulatory Visit: Payer: Self-pay

## 2024-09-06 DIAGNOSIS — M797 Fibromyalgia: Secondary | ICD-10-CM

## 2024-09-06 MED ORDER — COVID-19 MRNA VAC-TRIS(PFIZER) 30 MCG/0.3ML IM SUSY
0.3000 mL | PREFILLED_SYRINGE | Freq: Once | INTRAMUSCULAR | 0 refills | Status: AC
Start: 2024-09-06 — End: 2024-09-06

## 2024-09-06 MED ORDER — MORPHINE SULFATE ER 30 MG PO TBCR: EXTENDED_RELEASE_TABLET | ORAL | 0 refills | Status: DC

## 2024-09-06 NOTE — Telephone Encounter (Signed)
 Copied from CRM #[id number]. Topic: Clinical - Prescription Issue >> Sep 06, 2024  3:29 PM Felicia [name] wrote: Reason for CRM:  Felicia [name] is out of morphine  (MS CONTIN ) 30 MG 12 hr tablet as of yesterday. This medication reorder was sent to the incorrect pharmacy. Please send this order to refill to [facility]; [place], [place].  Please call Caroly with any questions at [phone].

## 2024-09-09 ENCOUNTER — Other Ambulatory Visit: Payer: Self-pay | Admitting: Pediatrics

## 2024-09-09 DIAGNOSIS — M797 Fibromyalgia: Secondary | ICD-10-CM

## 2024-09-09 NOTE — Telephone Encounter (Signed)
 Copied from CRM #[id number]. Topic: Clinical - Prescription Issue >> Sep 06, 2024  3:29 PM Montie POUR wrote: Reason for CRM:  [facility] is out of morphine  (MS CONTIN ) 30 MG 12 hr tablet as of yesterday. This medication reorder was sent to the incorrect pharmacy. Please send this order to refill to [facility]; [place], [place].  Please call [place] with any questions at [phone]. >> Sep 09, 2024 11:42 AM Mia F wrote: Pt is following up to see if the rx request for morphine  (MS CONTIN ) 30 MG 12 hr tablet has been sent to [facility] - [place],  - [address] [facility] not [facility] for any of her medications. Please contact pt when complete. Please advise

## 2024-09-09 NOTE — Telephone Encounter (Signed)
 Requested medication (s) are due for refill today - no  Requested medication (s) are on the active medication list -yes  Future visit scheduled -yes  Last refill: 09/06/24 #84  Notes to clinic: Patient is requesting different pharmacy- see note, non delegated Rx  Requested Prescriptions  Pending Prescriptions Disp Refills   morphine  (MS CONTIN ) 30 MG 12 hr tablet 84 tablet 0    Sig: Two pills in the morning, one at night     Not Delegated - Analgesics:  Opioid Agonists Failed - 09/09/2024 11:59 AM      Failed - This refill cannot be delegated      Failed - Urine Drug Screen completed in last 360 days      Passed - Valid encounter within last 3 months    Recent Outpatient Visits           4 days ago Need for vaccination   [facility] Herold Hadassah SQUIBB, MD   1 month ago Recurrent major depressive disorder, in partial remission [facility])   [place] [facility] Herold Hadassah SQUIBB, MD                 Requested Prescriptions  Pending Prescriptions Disp Refills   morphine  (MS CONTIN ) 30 MG 12 hr tablet 84 tablet 0    Sig: Two pills in the morning, one at night     Not Delegated - Analgesics:  Opioid Agonists Failed - 09/09/2024 11:59 AM      Failed - This refill cannot be delegated      Failed - Urine Drug Screen completed in last 360 days      Passed - Valid encounter within last 3 months    Recent Outpatient Visits           4 days ago Need for vaccination   [facility] Herold Hadassah SQUIBB, MD   1 month ago Recurrent major depressive disorder, in partial remission [facility])   [facility] Herold Hadassah SQUIBB, MD

## 2024-09-10 MED ORDER — MORPHINE SULFATE ER 30 MG PO TBCR: EXTENDED_RELEASE_TABLET | ORAL | 0 refills | Status: DC

## 2024-09-11 NOTE — Telephone Encounter (Signed)
 Called and spoke with pharmacy , stated because the quantity limit was exceeded and pt needs a PA for the script

## 2024-09-12 ENCOUNTER — Other Ambulatory Visit (HOSPITAL_COMMUNITY): Payer: Self-pay

## 2024-09-12 ENCOUNTER — Telehealth: Payer: Self-pay

## 2024-09-12 NOTE — Telephone Encounter (Signed)
 Pharmacy Patient Advocate Encounter   Received notification from Physician's Office that prior authorization for MS Contin  30MG  er tablets is required/requested.   Insurance verification completed.   The patient is insured through [facility] .   Per test claim: PA required; PA submitted to above mentioned insurance via Latent Key/confirmation #/EOC [id number] Status is pending

## 2024-09-12 NOTE — Telephone Encounter (Signed)
 PA request has been Approved. New Encounter has been or will be created for follow up. For additional info see Pharmacy Prior Auth telephone encounter from 09/12/2024.

## 2024-09-12 NOTE — Telephone Encounter (Signed)
 Pharmacy Patient Advocate Encounter  Received notification from [employer] [facility] that Prior Authorization for MS Contin  30MG  er tablets has been APPROVED from 12/27/2023 to 12/25/2024   PA #/Case ID/Reference #: [id number]

## 2024-09-13 ENCOUNTER — Other Ambulatory Visit: Payer: Self-pay

## 2024-09-13 ENCOUNTER — Telehealth: Payer: Self-pay

## 2024-09-13 DIAGNOSIS — Z87891 Personal history of nicotine dependence: Secondary | ICD-10-CM

## 2024-09-13 DIAGNOSIS — Z122 Encounter for screening for malignant neoplasm of respiratory organs: Secondary | ICD-10-CM

## 2024-09-13 NOTE — Telephone Encounter (Signed)
 Called and spoke with patient. Reviewed recent Lung CT results. She is in agreement to complete a 6 month follow up scan to evaluate the new 4.3 mm nodule. Order placed. Results and plan to PCP.

## 2024-09-13 NOTE — Telephone Encounter (Signed)
 Call report form TIffany   IMPRESSION: 1. Lung-RADS 3, probably benign findings. Short-term follow-up in 6 months is recommended with repeat low-dose chest CT without contrast (please use the following order, CT CHEST LCS NODULE FOLLOW-UP W/O CM). New 4.3 mm apical right upper lobe solid pulmonary nodule. 2.  Emphysema (ICD10-J43.9).

## 2024-09-17 ENCOUNTER — Encounter: Payer: Self-pay | Admitting: Pediatrics

## 2024-09-17 NOTE — Assessment & Plan Note (Signed)
 Transition from Rexulti  to Vraylar  was smooth. Current dose of Vraylar  is 1.5 mg, with some benefit noted. She believes a higher dose may be more effective. - Increase Vraylar  dose to 3 mg and monitor response. - Provide prescription for Vraylar  with titration packs up to 4.5 mg. - Instruct her to call if 3 mg is insufficient, as prescription allows titration up to 6 mg. - Discuss potential referral to psychiatry for a second opinion if desired.

## 2024-09-17 NOTE — Assessment & Plan Note (Signed)
 On xanax 1mg  TID. Has been on this as steady dose for years so concerned about withdrawal potential. Consider psych referral if amenable to make safer long term plan. While she has been on this regimen for years, I am concerned about worsened sedation as she ages. Needs controlled substance agreement signed.

## 2024-09-17 NOTE — Assessment & Plan Note (Signed)
 She is on a 28-day supply of morphine  which I am concerned about given chronic benzo use as well. There is an issue with the pharmacy not having the order, possibly due to the high dose being flagged in the system. - Discussed will need to sign controlled substance agreement - Consider pain management clinic referral

## 2024-09-18 ENCOUNTER — Other Ambulatory Visit: Payer: Self-pay | Admitting: Pediatrics

## 2024-09-19 NOTE — Telephone Encounter (Signed)
 Requested medications are due for refill today.  yes  Requested medications are on the active medications list.  yes  Last refill. 08/19/2024 #14 0 rf  Future visit scheduled.   yes  Notes to clinic.  Refill not delegated.    Requested Prescriptions  Pending Prescriptions Disp Refills   butalbital -acetaminophen -caffeine  (FIORICET) 50-325-40 MG tablet [Pharmacy Med Name: BUTALBITAL -APAP-CAFFEINE  50-325-40] 14 tablet     Sig: TAKE 1 TABLET BY MOUTH EVERY 6 HOURS AS NEEDED FOR HEADACHE     Not Delegated - Analgesics:  Non-Opioid Analgesic Combinations 2 Failed - 09/19/2024  4:15 PM      Failed - This refill cannot be delegated      Failed - Cr in normal range and within 360 days    Creatinine, Ser  Date Value Ref Range Status  10/31/2018 0.53 0.44 - 1.00 mg/dL Final         Failed - eGFR is 10 or above and within 360 days    GFR calc Af Amer  Date Value Ref Range Status  10/31/2018 >60 >60 mL/min Final    Comment:    (NOTE) The eGFR has been calculated using the CKD EPI equation. This calculation has not been validated in all clinical situations. eGFR's persistently <60 mL/min signify possible Chronic Kidney Disease.    GFR calc non Af Amer  Date Value Ref Range Status  10/31/2018 >60 >60 mL/min Final         Passed - Patient is not pregnant      Passed - Valid encounter within last 12 months    Recent Outpatient Visits           2 weeks ago Recurrent major depressive disorder, in partial remission   [place] [facility] Herold Hadassah SQUIBB, MD   1 month ago Recurrent major depressive disorder, in partial remission   [place] [facility] Herold Hadassah SQUIBB, MD

## 2024-10-07 ENCOUNTER — Other Ambulatory Visit: Payer: Self-pay | Admitting: Pediatrics

## 2024-10-07 DIAGNOSIS — M797 Fibromyalgia: Secondary | ICD-10-CM

## 2024-10-08 NOTE — Telephone Encounter (Signed)
 Requested medications are due for refill today.  yes  Requested medications are on the active medications list.  yes  Last refill. 09/10/2024 #84 0 rf  Future visit scheduled.   yes  Notes to clinic.  Refill not delegated.    Requested Prescriptions  Pending Prescriptions Disp Refills   morphine  (MS CONTIN ) 30 MG 12 hr tablet [Pharmacy Med Name: MORPHINE  SULFATE ER 30 MG TAB] 84 tablet     Sig: TAKE 2 TABLETS BY MOUTH ONCE EVERY MORNING AND 1 TABLET AT BEDTIME     Not Delegated - Analgesics:  Opioid Agonists Failed - 10/08/2024  5:58 PM      Failed - This refill cannot be delegated      Failed - Urine Drug Screen completed in last 360 days      Passed - Valid encounter within last 3 months    Recent Outpatient Visits           1 month ago Recurrent major depressive disorder, in partial remission   [place] [facility] Herold Hadassah SQUIBB, MD   2 months ago Recurrent major depressive disorder, in partial remission   [place] [facility] Herold Hadassah SQUIBB, MD

## 2024-10-09 ENCOUNTER — Other Ambulatory Visit: Payer: Self-pay | Admitting: Pediatrics

## 2024-10-09 DIAGNOSIS — M797 Fibromyalgia: Secondary | ICD-10-CM

## 2024-10-09 MED ORDER — MORPHINE SULFATE ER 30 MG PO TBCR: EXTENDED_RELEASE_TABLET | ORAL | 0 refills | Status: DC

## 2024-10-09 NOTE — Progress Notes (Signed)
 Provided next 3 mo refills. Will need appointment for further refills. Needs controlled substance agreement signed.  Hadassah SHAUNNA Nett, MD

## 2024-10-10 ENCOUNTER — Ambulatory Visit (INDEPENDENT_AMBULATORY_CARE_PROVIDER_SITE_OTHER)

## 2024-10-10 DIAGNOSIS — Z0000 Encounter for general adult medical examination without abnormal findings: Secondary | ICD-10-CM

## 2024-10-10 NOTE — Progress Notes (Signed)
 Subjective:   Felicia [name] is a 53 y.o. female who presents for Medicare Annual (Subsequent) preventive examination.  Visit Complete: Virtual I connected with  Olam CHRISTELLA Finder on 10/10/24 by a audio enabled telemedicine application and verified that I am speaking with the correct person using two identifiers.  Patient Location: Home  Provider Location: Office/Clinic  I discussed the limitations of evaluation and management by telemedicine. The patient expressed understanding and agreed to proceed.  Vital Signs: Because this visit was a virtual/telehealth visit, some criteria may be missing or patient reported. Any vitals not documented were not able to be obtained and vitals that have been documented are patient reported.  Patient Medicare AWV questionnaire was completed by the patient on 10/10/24; I have confirmed that all information answered by patient is correct and no changes since this date.  Cardiac Risk Factors include: none     Objective:    Today's Vitals   10/10/24 1040  PainSc: 7    There is no height or weight on file to calculate BMI.     10/10/2024   10:49 AM 08/24/2023    8:08 AM 08/04/2020    8:02 AM 11/18/2018   12:18 PM 10/31/2018    4:50 PM 09/25/2018    6:05 PM  Advanced Directives  Does Patient Have a Medical Advance Directive? No No No No  Yes  No   Would patient like information on creating a medical advance directive? No - Patient declined   No - Patient declined   No - Patient declined      Data saved with a previous flowsheet row definition    Current Medications (verified) Outpatient Encounter Medications as of 10/10/2024  Medication Sig   albuterol (PROVENTIL HFA;VENTOLIN HFA) 108 (90 Base) MCG/ACT inhaler Inhale into the lungs.   ALPRAZolam (XANAX) 0.5 MG tablet Take 0.5 mg by mouth 4 (four) times daily as needed.   butalbital -acetaminophen -caffeine  (FIORICET) 50-325-40 MG tablet TAKE 1 TABLET BY MOUTH EVERY 6 HOURS AS NEEDED FOR HEADACHE    cariprazine  (VRAYLAR ) 3 MG capsule Take 1 capsule (3 mg total) by mouth daily.   Fluticasone-Umeclidin-Vilant (TRELEGY ELLIPTA) 100-62.5-25 MCG/ACT AEPB Inhale 1 puff into the lungs daily.   ibuprofen  (ADVIL ) 800 MG tablet Take 1 tablet (800 mg total) by mouth 3 (three) times daily.   montelukast (SINGULAIR) 10 MG tablet Take 10 mg by mouth daily.   morphine  (MS CONTIN ) 30 MG 12 hr tablet TAKE 2 TABLETS BY MOUTH ONCE EVERY MORNING AND 1 TABLET AT BEDTIME   [START ON 12/08/2024] morphine  (MS CONTIN ) 30 MG 12 hr tablet TAKE 2 TABLETS BY MOUTH ONCE EVERY MORNING AND 1 TABLET AT BEDTIME   [START ON 11/08/2024] morphine  (MS CONTIN ) 30 MG 12 hr tablet TAKE 2 TABLETS BY MOUTH ONCE EVERY MORNING AND 1 TABLET AT BEDTIME   pregabalin (LYRICA) 150 MG capsule Take 150 mg by mouth 2 (two) times daily.   traZODone (DESYREL) 100 MG tablet Take 100 mg by mouth at bedtime.   venlafaxine XR (EFFEXOR-XR) 150 MG 24 hr capsule Take 150 mg by mouth daily.   venlafaxine XR (EFFEXOR-XR) 75 MG 24 hr capsule Take 75 mg by mouth daily with breakfast.   [DISCONTINUED] ALPRAZolam (XANAX) 1 MG tablet Take 1 mg by mouth 3 (three) times daily as needed.   No facility-administered encounter medications on file as of 10/10/2024.    Allergies (verified) Patient has no known allergies.   History: Past Medical History:  Diagnosis Date  Anxiety    COPD (chronic obstructive pulmonary disease) (HCC)    Depression    Fibromyalgia    Migraine    Multilevel degenerative disc disease 08/06/2024   Panic disorder    Past Surgical History:  Procedure Laterality Date   AUGMENTATION MAMMAPLASTY     CESAREAN SECTION     COLONOSCOPY WITH PROPOFOL  N/A 08/04/2020   Procedure: COLONOSCOPY WITH PROPOFOL ;  Surgeon: Unk Corinn Skiff, MD;  Location: [facility] ENDOSCOPY;  Service: Gastroenterology;  Laterality: N/A;   COLONOSCOPY WITH PROPOFOL  N/A 08/24/2023   Procedure: COLONOSCOPY WITH PROPOFOL ;  Surgeon: Unk Corinn Skiff, MD;   Location[facility]COPY;  Service: Gastroenterology;  Laterality: N/A;   POLYPECTOMY  08/24/2023   Procedure: POLYPECTOMY;  Surgeon: Unk Corinn Skiff, MD;  Location: [facility] ENDOSCOPY;  Service: Gastroenterology;;   Family History  Problem Relation Age of Onset   Cancer Maternal Uncle        colon   Diabetes Maternal Grandfather    Breast cancer Father    Anxiety disorder Father    Cancer Father    Social History   Socioeconomic History   Marital status: Married    Spouse name: Not on file   Number of children: Not on file   Years of education: Not on file   Highest education level: GED or equivalent  Occupational History   Not on file  Tobacco Use   Smoking status: Former    Current packs/day: 0.00    Average packs/day: 2.0 packs/day for 36.0 years (72.0 ttl pk-yrs)    Types: Cigarettes, E-cigarettes    Quit date: 07/31/2020    Years since quitting: 4.1   Smokeless tobacco: Never  Vaping Use   Vaping status: Some Days  Substance and Sexual Activity   Alcohol use: No   Drug use: No   Sexual activity: Yes    Birth control/protection: I.U.D.  Other Topics Concern   Not on file  Social History Narrative   Not on file   Social Drivers of Health   Financial Resource Strain: Low Risk  (10/10/2024)   Overall Financial Resource Strain (CARDIA)    Difficulty of Paying Living Expenses: Not hard at all  Recent Concern: Financial Resource Strain - Medium Risk (08/02/2024)   Overall Financial Resource Strain (CARDIA)    Difficulty of Paying Living Expenses: Somewhat hard  Food Insecurity: No Food Insecurity (10/10/2024)   Hunger Vital Sign    Worried About Running Out of Food in the Last Year: Never true    Ran Out of Food in the Last Year: Never true  Transportation Needs: No Transportation Needs (10/10/2024)   PRAPARE - Administrator, Civil Service (Medical): No    Lack of Transportation (Non-Medical): No  Physical Activity: Inactive (10/10/2024)   Exercise  Vital Sign    Days of Exercise per Week: 0 days    Minutes of Exercise per Session: 0 min  Stress: Stress Concern Present (10/10/2024)   [employer] of Occupational Health - Occupational Stress Questionnaire    Feeling of Stress: Rather much  Social Connections: Moderately Isolated (10/10/2024)   Social Connection and Isolation Panel    Frequency of Communication with Friends and Family: Twice a week    Frequency of Social Gatherings with Friends and Family: Twice a week    Attends Religious Services: More than 4 times per year    Active Member of [employer] or Organizations: No    Attends Banker Meetings: Never    Marital  Status: Widowed    Tobacco Counseling Counseling given: Not Answered   Clinical Intake:  Pre-visit preparation completed: Yes  Pain : 0-10 Pain Score: 7  Pain Type: Chronic pain     Nutritional Risks: None Diabetes: No  How often do you need to have someone help you when you read instructions, pamphlets, or other written materials from your doctor or pharmacy?: 1 - Never What is the last grade level you completed in school?: 12th grade  Interpreter Needed?: No  Information entered by :: Laymon Metro, CMA   Activities of Daily Living    10/10/2024   10:41 AM  In your present state of health, do you have any difficulty performing the following activities:  Hearing? 0  Vision? 1  Difficulty concentrating or making decisions? 1  Walking or climbing stairs? 0  Dressing or bathing? 0  Doing errands, shopping? 1  Preparing Food and eating ? N  Using the Toilet? N  In the past six months, have you accidently leaked urine? N  Do you have problems with loss of bowel control? N  Managing your Medications? N  Managing your Finances? N  Housekeeping or managing your Housekeeping? N    Patient Care Team: Herold Hadassah SQUIBB, MD as PCP - General (Family Medicine)  Indicate any recent Medical Services you may have received from other  than [facility] providers in the past year (date may be approximate).     Assessment:   This is a routine wellness examination for Thy.  Hearing/Vision screen No results found.   Goals Addressed   None    Depression Screen    10/10/2024   10:48 AM 09/05/2024   11:03 AM 08/06/2024    3:15 PM 07/02/2020    2:00 PM  PHQ 2/9 Scores  PHQ - 2 Score 2 2 1 2   PHQ- 9 Score 4 9 3 14     Fall Risk    10/10/2024   10:48 AM 09/05/2024   11:03 AM 08/06/2024    3:14 PM  Fall Risk   Falls in the past year? 0 0 0  Number falls in past yr: 0  0  Injury with Fall? 0  0  Risk for fall due to : No Fall Risks  No Fall Risks  Follow up Falls evaluation completed  Falls evaluation completed    MEDICARE RISK AT HOME: Medicare Risk at Home Any stairs in or around the home?: No If so, are there any without handrails?: No Home free of loose throw rugs in walkways, pet beds, electrical cords, etc?: Yes Adequate lighting in your home to reduce risk of falls?: Yes Life alert?: No Use of a cane, [place] or w/c?: No Grab bars in the bathroom?: No Shower chair or bench in shower?: No Elevated toilet seat or a handicapped toilet?: No  TIMED UP AND GO:  Was the test performed?  No    Cognitive Function:        10/10/2024   10:50 AM  6CIT Screen  What Year? 0 points  What month? 0 points  What time? 0 points  Count back from 20 0 points  Months in reverse 0 points  Repeat phrase 2 points  Total Score 2 points    Immunizations Immunization History  Administered Date(s) Administered   Influenza, Seasonal, Injecte, Preservative Fre 09/05/2024   Influenza,inj,Quad PF,6+ Mos 10/12/2018    TDAP status: Due, Education has been provided regarding the importance of this vaccine. Advised may receive this vaccine at  local pharmacy or Health Dept. Aware to provide a copy of the vaccination record if obtained from local pharmacy or Health Dept. Verbalized acceptance and understanding.  Flu Vaccine  status: Up to date  Pneumococcal vaccine status: Due, Education has been provided regarding the importance of this vaccine. Advised may receive this vaccine at local pharmacy or Health Dept. Aware to provide a copy of the vaccination record if obtained from local pharmacy or Health Dept. Verbalized acceptance and understanding.  Covid-19 vaccine status: Declined, Education has been provided regarding the importance of this vaccine but patient still declined. Advised may receive this vaccine at local pharmacy or Health Dept.or vaccine clinic. Aware to provide a copy of the vaccination record if obtained from local pharmacy or Health Dept. Verbalized acceptance and understanding.  Qualifies for Shingles Vaccine? Yes   Zostavax completed No   Shingrix Completed?: No.    Education has been provided regarding the importance of this vaccine. Patient has been advised to call insurance company to determine out of pocket expense if they have not yet received this vaccine. Advised may also receive vaccine at local pharmacy or Health Dept. Verbalized acceptance and understanding.  Screening Tests Health Maintenance  Topic Date Due   HIV Screening  Never done   Hepatitis C Screening  Never done   DTaP/Tdap/Td (1 - Tdap) Never done   Pneumococcal Vaccine: 50+ Years (1 of 2 - PCV) Never done   Hepatitis B Vaccines 19-59 Average Risk (1 of 3 - 19+ 3-dose series) Never done   Zoster Vaccines- Shingrix (1 of 2) Never done   COVID-19 Vaccine (6 - 2025-26 season) 08/26/2024   Mammogram  03/26/2025   Lung Cancer Screening  09/02/2025   Medicare Annual Wellness (AWV)  10/10/2025   Colonoscopy  08/23/2026   Cervical Cancer Screening (HPV/Pap Cotest)  02/02/2028   Influenza Vaccine  Completed   HPV VACCINES  Aged Out   Meningococcal B Vaccine  Aged Out    Health Maintenance  Health Maintenance Due  Topic Date Due   HIV Screening  Never done   Hepatitis C Screening  Never done   DTaP/Tdap/Td (1 - Tdap)  Never done   Pneumococcal Vaccine: 50+ Years (1 of 2 - PCV) Never done   Hepatitis B Vaccines 19-59 Average Risk (1 of 3 - 19+ 3-dose series) Never done   Zoster Vaccines- Shingrix (1 of 2) Never done   COVID-19 Vaccine (6 - 2025-26 season) 08/26/2024    Colorectal cancer screening: Type of screening: Colonoscopy. Completed 08/24/23. Repeat every 3 years  Mammogram status: Completed 03/27/23. Repeat every year   Lung Cancer Screening: (Low Dose CT Chest recommended if Age 74-80 years, 20 pack-year currently smoking OR have quit w/in 15years.) does not qualify.   Lung Cancer Screening Referral: N/A  Additional Screening:  Hepatitis C Screening: does qualify; Completed- not completed  Vision Screening: Recommended annual ophthalmology exams for early detection of glaucoma and other disorders of the eye. Is the patient up to date with their annual eye exam?  No  Who is the provider or what is the name of the office in which the patient attends annual eye exams? N/A If pt is not established with a provider, would they like to be referred to a provider to establish care? No .   Dental Screening: Recommended annual dental exams for proper oral hygiene  Diabetic Foot Exam:   Community Resource Referral / Chronic Care Management: CRR required this visit?  No   CCM  required this visit?  No     Plan:     I have personally reviewed and noted the following in the patient's chart:   Medical and social history Use of alcohol, tobacco or illicit drugs  Current medications and supplements including opioid prescriptions. Patient is not currently taking opioid prescriptions. Functional ability and status Nutritional status Physical activity Advanced directives List of other physicians Hospitalizations, surgeries, and ER visits in previous 12 months Vitals Screenings to include cognitive, depression, and falls Referrals and appointments  In addition, I have reviewed and discussed with  patient certain preventive protocols, quality metrics, and best practice recommendations. A written personalized care plan for preventive services as well as general preventive health recommendations were provided to patient.     Laymon LOISE Metro, CMA   10/10/2024   After Visit Summary: (MyChart) Due to this being a telephonic visit, the after visit summary with patients personalized plan was offered to patient via MyChart

## 2024-10-17 ENCOUNTER — Other Ambulatory Visit: Payer: Self-pay | Admitting: Pediatrics

## 2024-10-17 DIAGNOSIS — F3341 Major depressive disorder, recurrent, in partial remission: Secondary | ICD-10-CM

## 2024-10-18 NOTE — Telephone Encounter (Signed)
 Requested medications are due for refill today.  unsure  Requested medications are on the active medications list.  yes  Last refill. Vraylar  #30 1 rf, Fioricet 09/20/2024 #14 2 rf  Future visit scheduled.   yes  Notes to clinic.  Please review for refill. - pt is requesting larger quantity of Fioricet.    Requested Prescriptions  Pending Prescriptions Disp Refills   VRAYLAR  3 MG capsule [Pharmacy Med Name: VRAYLAR  3 MG CAP] 30 capsule 1    Sig: TAKE 1 CAPSULE BY MOUTH ONCE DAILY     Off-Protocol Failed - 10/18/2024  3:36 PM      Failed - Medication not assigned to a protocol, review manually.      Passed - Valid encounter within last 12 months    Recent Outpatient Visits           1 week ago Encounter for [employer] annual wellness exam   [place] [facility] Nelwyn Laymon SAILOR, CMA   1 month ago Recurrent major depressive disorder, in partial remission   [place] [facility] Herold Hadassah SQUIBB, MD   2 months ago Recurrent major depressive disorder, in partial remission   [place] [facility] Herold Hadassah SQUIBB, MD               butalbital -acetaminophen -caffeine  (FIORICET) 50-325-40 MG tablet [Pharmacy Med Name: BUTALBITAL -APAP-CAFFEINE  50-325-40] 14 tablet     Sig: TAKE 1 TABLET BY MOUTH EVERY 6 HOURS AS NEEDED FOR HEADACHE     Not Delegated - Analgesics:  Non-Opioid Analgesic Combinations 2 Failed - 10/18/2024  3:36 PM      Failed - This refill cannot be delegated      Failed - Cr in normal range and within 360 days    Creatinine, Ser  Date Value Ref Range Status  10/31/2018 0.53 0.44 - 1.00 mg/dL Final         Failed - eGFR is 10 or above and within 360 days    GFR calc Af Amer  Date Value Ref Range Status  10/31/2018 >60 >60 mL/min Final    Comment:    (NOTE) The eGFR has been calculated using the CKD EPI equation. This calculation has not been validated in all clinical situations. eGFR's persistently <60 mL/min  signify possible Chronic Kidney Disease.    GFR calc non Af Amer  Date Value Ref Range Status  10/31/2018 >60 >60 mL/min Final         Passed - Patient is not pregnant      Passed - Valid encounter within last 12 months    Recent Outpatient Visits           1 week ago Encounter for [employer] annual wellness exam   [place] [facility] Nelwyn Laymon SAILOR, CMA   1 month ago Recurrent major depressive disorder, in partial remission   [place] [facility] Herold Hadassah SQUIBB, MD   2 months ago Recurrent major depressive disorder, in partial remission   [place] [facility] Herold Hadassah SQUIBB, MD

## 2024-10-28 ENCOUNTER — Other Ambulatory Visit: Payer: Self-pay | Admitting: Nurse Practitioner

## 2024-10-28 NOTE — Telephone Encounter (Unsigned)
 Copied from CRM [id number]. Topic: Clinical - Medication Refill >> Oct 28, 2024 11:34 AM Victoria B wrote: Medication: ALPRAZolam (XANAX) 0.5 MG tablet  Has the patient contacted their pharmacy? Yes (Agent: If yes, when and what did the pharmacy advise?)contact pcp  This is the patient's preferred pharmacy:  [facility] [place] [place] - [address] [place] [address] Phone: [phone] Fax: [phone]  Is this the correct pharmacy for this prescription? yes    Has the prescription been filled recently? no  Is the patient out of the medication? yes  Has the patient been seen for an appointment in the last year OR does the patient have an upcoming appointment? yes  Can we respond through MyChart? yes  Agent: Please be advised that Rx refills may take up to 3 business days. We ask that you follow-up with your pharmacy.

## 2024-10-29 NOTE — Telephone Encounter (Signed)
 Requested medication (s) are due for refill today: yes  Requested medication (s) are on the active medication list: yes  Last refill:  10/10/24  Future visit scheduled: yes  Notes to clinic:  Unable to refill per protocol, cannot delegate.      Requested Prescriptions  Pending Prescriptions Disp Refills   ALPRAZolam (XANAX) 0.5 MG tablet      Sig: Take 1 tablet (0.5 mg total) by mouth 4 (four) times daily as needed.     Not Delegated - Psychiatry: Anxiolytics/Hypnotics 2 Failed - 10/29/2024  4:23 PM      Failed - This refill cannot be delegated      Failed - Urine Drug Screen completed in last 360 days      Passed - Patient is not pregnant      Passed - Valid encounter within last 6 months    Recent Outpatient Visits           2 weeks ago Encounter for [employer] annual wellness exam   [place] [facility] Nelwyn Laymon SAILOR, CMA   1 month ago Recurrent major depressive disorder, in partial remission   [place] [facility] Herold Hadassah SQUIBB, MD   2 months ago Recurrent major depressive disorder, in partial remission   [place] [facility] Herold Hadassah SQUIBB, MD

## 2024-10-29 NOTE — Telephone Encounter (Signed)
 Pharmacy calling to request refill on behalf of patient. Requesting for this to be expedited if possible. Previous provider has retired.

## 2024-10-29 NOTE — Telephone Encounter (Signed)
 Requested medication (s) are due for refill today: yes  Requested medication (s) are on the active medication list: yes  Last refill:  10/10/24  Future visit scheduled: yes  Notes to clinic:  Unable to refill per protocol, cannot delegate.      Requested Prescriptions  Pending Prescriptions Disp Refills   ALPRAZolam (XANAX) 0.5 MG tablet      Sig: Take 1 tablet (0.5 mg total) by mouth 4 (four) times daily as needed.     Not Delegated - Psychiatry: Anxiolytics/Hypnotics 2 Failed - 10/29/2024  4:22 PM      Failed - This refill cannot be delegated      Failed - Urine Drug Screen completed in last 360 days      Passed - Patient is not pregnant      Passed - Valid encounter within last 6 months    Recent Outpatient Visits           2 weeks ago Encounter for [employer] annual wellness exam   [place] [facility] Nelwyn Laymon SAILOR, CMA   1 month ago Recurrent major depressive disorder, in partial remission   [place] [facility] Herold Hadassah SQUIBB, MD   2 months ago Recurrent major depressive disorder, in partial remission    [facility] Herold Hadassah SQUIBB, MD

## 2024-10-30 ENCOUNTER — Other Ambulatory Visit: Payer: Self-pay | Admitting: Pediatrics

## 2024-10-30 MED ORDER — ALPRAZOLAM 0.5 MG PO TABS: 0.5000 mg | ORAL_TABLET | Freq: Four times a day (QID) | ORAL | 0 refills | Status: DC | PRN

## 2024-11-12 ENCOUNTER — Other Ambulatory Visit (HOSPITAL_COMMUNITY): Payer: Self-pay

## 2024-11-18 ENCOUNTER — Other Ambulatory Visit: Payer: Self-pay | Admitting: Pediatrics

## 2024-11-19 MED ORDER — TRAZODONE HCL 100 MG PO TABS: 100.0000 mg | ORAL_TABLET | Freq: Every day | ORAL | 1 refills | Status: AC

## 2024-11-27 ENCOUNTER — Other Ambulatory Visit: Payer: Self-pay | Admitting: Pediatrics

## 2024-11-29 NOTE — Telephone Encounter (Signed)
 Requested medications are due for refill today.  yes  Requested medications are on the active medications list.  yes  Last refill. 10/30/2024 #120 0 rf  Future visit scheduled.   yes  Notes to clinic.  Refill not delegated.    Requested Prescriptions  Pending Prescriptions Disp Refills   ALPRAZolam  (XANAX ) 0.5 MG tablet [Pharmacy Med Name: ALPRAZOLAM  0.5 MG TAB] 120 tablet     Sig: TAKE 1 TABLET BY MOUTH 4 TIMES DAILY AS NEEDED     Not Delegated - Psychiatry: Anxiolytics/Hypnotics 2 Failed - 11/29/2024  4:24 PM      Failed - This refill cannot be delegated      Failed - Urine Drug Screen completed in last 360 days      Passed - Patient is not pregnant      Passed - Valid encounter within last 6 months    Recent Outpatient Visits           1 month ago Encounter for [employer] annual wellness exam   [place] [facility] Nelwyn Laymon SAILOR, CMA   2 months ago Recurrent major depressive disorder, in partial remission   [place] [facility] Herold Hadassah SQUIBB, MD   3 months ago Recurrent major depressive disorder, in partial remission   [place] [facility] Herold Hadassah SQUIBB, MD

## 2024-12-02 DIAGNOSIS — R0609 Other forms of dyspnea: Secondary | ICD-10-CM | POA: Diagnosis not present

## 2024-12-02 DIAGNOSIS — R911 Solitary pulmonary nodule: Secondary | ICD-10-CM | POA: Diagnosis not present

## 2024-12-02 DIAGNOSIS — J4520 Mild intermittent asthma, uncomplicated: Secondary | ICD-10-CM | POA: Diagnosis not present

## 2024-12-05 ENCOUNTER — Other Ambulatory Visit: Payer: Self-pay | Admitting: Pediatrics

## 2024-12-05 DIAGNOSIS — M797 Fibromyalgia: Secondary | ICD-10-CM

## 2024-12-09 NOTE — Telephone Encounter (Signed)
 Requested medications are due for refill today.  no  Requested medications are on the active medications list.  yes  Last refill. 12/08/2024 #84 0 rf  Future visit scheduled.   yes  Notes to clinic.  Refusal not delegated.    Requested Prescriptions  Pending Prescriptions Disp Refills   morphine  (MS CONTIN ) 30 MG 12 hr tablet [Pharmacy Med Name: MORPHINE  SULFATE ER 30 MG TAB] 84 tablet     Sig: TAKE 2 TABLETS BY MOUTH ONCE EVERY MORNING AND 1 TABLET AT BEDTIME     Not Delegated - Analgesics:  Opioid Agonists Failed - 12/09/2024 11:18 AM      Failed - This refill cannot be delegated      Failed - Urine Drug Screen completed in last 360 days      Failed - Valid encounter within last 3 months    Recent Outpatient Visits           2 months ago Encounter for Medicare annual wellness exam   [place] [facility] Felicia [name], Felicia [name]   3 months ago Recurrent major depressive disorder, in partial remission   [place] [facility] Felicia Hadassah SQUIBB, Felicia [name]   4 months ago Recurrent major depressive disorder, in partial remission   [place] [facility] Felicia Hadassah SQUIBB, Felicia [name]

## 2025-01-03 ENCOUNTER — Encounter: Payer: Self-pay | Admitting: Nurse Practitioner

## 2025-01-03 ENCOUNTER — Ambulatory Visit (INDEPENDENT_AMBULATORY_CARE_PROVIDER_SITE_OTHER): Admitting: Nurse Practitioner

## 2025-01-03 VITALS — BP 122/86 | HR 64 | Temp 97.7°F | Ht 64.02 in | Wt 165.6 lb

## 2025-01-03 DIAGNOSIS — F3341 Major depressive disorder, recurrent, in partial remission: Secondary | ICD-10-CM

## 2025-01-03 DIAGNOSIS — J439 Emphysema, unspecified: Secondary | ICD-10-CM | POA: Diagnosis not present

## 2025-01-03 DIAGNOSIS — M797 Fibromyalgia: Secondary | ICD-10-CM | POA: Diagnosis not present

## 2025-01-03 DIAGNOSIS — F1320 Sedative, hypnotic or anxiolytic dependence, uncomplicated: Secondary | ICD-10-CM | POA: Diagnosis not present

## 2025-01-03 MED ORDER — MORPHINE SULFATE ER 30 MG PO TBCR: EXTENDED_RELEASE_TABLET | ORAL | 0 refills | Status: AC

## 2025-01-03 MED ORDER — ALPRAZOLAM 0.5 MG PO TABS: 0.5000 mg | ORAL_TABLET | Freq: Four times a day (QID) | ORAL | 2 refills | Status: AC | PRN

## 2025-01-03 MED ORDER — VRAYLAR 4.5 MG PO CAPS: 4.5000 mg | ORAL_CAPSULE | Freq: Every day | ORAL | 0 refills | Status: AC

## 2025-01-03 MED ORDER — BUTALBITAL-APAP-CAFFEINE 50-325-40 MG PO TABS: 1.0000 | ORAL_TABLET | ORAL | 2 refills | Status: AC | PRN

## 2025-01-03 NOTE — Progress Notes (Signed)
 "  BP 122/86 (BP Location: Left Arm, Patient Position: Sitting, Cuff Size: Normal)   Pulse 64   Temp 97.7 F (36.5 C) (Oral)   Ht 5' 4.02 (1.626 m)   Wt 165 lb 9.6 oz (75.1 kg)   LMP 10/10/2018   SpO2 98%   BMI 28.41 kg/m    Subjective:    Patient ID: Felicia [name], female    DOB: [birth year], 54 y.o.   MRN: [id number]  HPI: Felicia [name] is a 54 y.o. female  Chief Complaint  Patient presents with   Establish Care   Felicia [name] is a 54 year old female with depression and anxiety who presents for medication management.  She has transitioned from Rexulti  to Vraylar , currently taking 3mg .  She felt like the Rexulti  stopped working for her.  She is currently 3mg  Vraylar .  She feels like it is helping.     Her morphine  prescription, has been ongoing for many years.  She is due for refills today.  Her Xanax  prescription is being managed without issues.  States she usually needs a PA for her Morphine .  She has a history of depression and anxiety since childhood and has not found psychiatry beneficial in the past. She is familiar with what treatments work for her conditions.  Her fibromyalgia symptoms tend to worsen in the winter months, impacting her ability to attend appointments, as seen with a missed dentist appointment due to snow.   COPD COPD status: controlled Satisfied with current treatment?: yes Oxygen use: no Dyspnea frequency: yes Cough frequency: no Rescue inhaler frequency:  doesn't use Limitation of activity: yes Productive cough: no Last Spirometry:  Pneumovax: Up to Date Influenza: Up to Date     Relevant past medical, surgical, family and social history reviewed and updated as indicated. Interim medical history since our last visit reviewed. Allergies and medications reviewed and updated.  Review of Systems  Respiratory:  Positive for shortness of breath. Negative for cough.   Musculoskeletal:  Positive for arthralgias and myalgias.   Psychiatric/Behavioral:  Positive for dysphoric mood and sleep disturbance. Negative for suicidal ideas.     Per HPI unless specifically indicated above     Objective:    BP 122/86 (BP Location: Left Arm, Patient Position: Sitting, Cuff Size: Normal)   Pulse 64   Temp 97.7 F (36.5 C) (Oral)   Ht 5' 4.02 (1.626 m)   Wt 165 lb 9.6 oz (75.1 kg)   LMP 10/10/2018   SpO2 98%   BMI 28.41 kg/m   Wt Readings from Last 3 Encounters:  01/03/25 165 lb 9.6 oz (75.1 kg)  09/05/24 157 lb 12.8 oz (71.6 kg)  08/06/24 154 lb 4.8 oz (70 kg)    Physical Exam Vitals and nursing note reviewed.  Constitutional:      General: She is not in acute distress.    Appearance: Normal appearance. She is normal weight. She is not ill-appearing, toxic-appearing or diaphoretic.  HENT:     Head: Normocephalic.     Right Ear: External ear normal.     Left Ear: External ear normal.     Nose: Nose normal.     Mouth/Throat:     Mouth: Mucous membranes are moist.     Pharynx: Oropharynx is clear.  Eyes:     General:        Right eye: No discharge.        Left eye: No discharge.     Extraocular Movements: Extraocular movements  intact.     Conjunctiva/sclera: Conjunctivae normal.     Pupils: Pupils are equal, round, and reactive to light.  Cardiovascular:     Rate and Rhythm: Normal rate and regular rhythm.     Heart sounds: No murmur heard. Pulmonary:     Effort: Pulmonary effort is normal. No respiratory distress.     Breath sounds: Normal breath sounds. No wheezing or rales.  Musculoskeletal:     Cervical back: Normal range of motion and neck supple.  Skin:    General: Skin is warm and dry.     Capillary Refill: Capillary refill takes less than 2 seconds.  Neurological:     General: No focal deficit present.     Mental Status: She is alert and oriented to person, place, and time. Mental status is at baseline.  Psychiatric:        Mood and Affect: Mood normal.        Behavior: Behavior normal.         Thought Content: Thought content normal.        Judgment: Judgment normal.     Results for orders placed or performed in visit on 02/01/23  Cytology - PAP   Collection Time: 02/01/23  9:40 AM  Result Value Ref Range   High risk HPV Negative    Neisseria Gonorrhea Negative    Chlamydia Negative    Adequacy      Satisfactory for evaluation; transformation zone component PRESENT.   Diagnosis      - Negative for intraepithelial lesion or malignancy (NILM)   Comment Normal Reference Range HPV - Negative    Comment Normal Reference Ranger Chlamydia - Negative    Comment      Normal Reference Range Neisseria Gonorrhea - Negative      Assessment & Plan:   Problem List Items Addressed This Visit       Respiratory   Emphysema lung (HCC) - Primary   Chronic.  Not able to get Trellegy due to cost.  Sample of Trellegy given in office. Has an appointment with Dr. Theotis in March.  Referral placed for pharmacy to help with cost.       Relevant Orders   AMB Referral [facility]I Care Management     Other   Sedative dependence (HCC)   On xanax  0.5mg  QID. Has been on this as steady dose for years so concerned about withdrawal potential. Consider psych referral if amenable to make safer long term plan. While she has been on this regimen for years, I am concerned about worsened sedation as she ages. I discussed safety concerns with patient during visit of long term Benzodiazepine use as well as taking medication with chronic Opiates.  Refills sent in for 3 months.  Increased dose of Vraylar  to 4.5mg  daily.  Follow up in 3 months.  Call sooner if concerns arise.       Fibromyalgia   She is on a 28-day supply of morphine  which I am concerned about given chronic benzo use as well. I have placed a referral for pain management due to the chronic nature and high dose of medication.  Discussed with patient my concern for taking Benzos and Opiates together.   There is an issue with the pharmacy not  having the order, possibly due to the high dose being flagged in the system.       Relevant Medications   butalbital -acetaminophen -caffeine  (FIORICET) 50-325-40 MG tablet   morphine  (MS CONTIN ) 30 MG 12 hr tablet (Start  on 01/07/2025)   morphine  (MS CONTIN ) 30 MG 12 hr tablet (Start on 02/07/2025)   morphine  (MS CONTIN ) 30 MG 12 hr tablet (Start on 03/07/2025)   Other Relevant Orders   Ambulatory referral to Pain Clinic   Recurrent major depressive disorder, in partial remission   On xanax  0.5mg  QID. Has been on this as steady dose for years so concerned about withdrawal potential. Consider psych referral if amenable to make safer long term plan. While she has been on this regimen for years, I am concerned about worsened sedation as she ages. I discussed safety concerns with patient during visit of long term Benzodiazepine use as well as taking medication with chronic Opiates.  Refills sent in for 3 months.  Increased dose of Vraylar  to 4.5mg  daily.  Follow up in 3 months.  Call sooner if concerns arise.       Relevant Medications   ALPRAZolam  (XANAX ) 0.5 MG tablet     Follow up plan: Return in about 3 months (around 04/03/2025) for Physical and Fasting labs, Medication Management.    A total of 30 minutes were spent on this encounter today.  When total time is documented, this includes both the face-to-face and non-face-to-face time personally spent before, during and after the visit on the date of the encounter medications, referrals, follow up.    "

## 2025-01-03 NOTE — Assessment & Plan Note (Signed)
 On xanax  0.5mg  QID. Has been on this as steady dose for years so concerned about withdrawal potential. Consider psych referral if amenable to make safer long term plan. While she has been on this regimen for years, I am concerned about worsened sedation as she ages. I discussed safety concerns with patient during visit of long term Benzodiazepine use as well as taking medication with chronic Opiates.  Refills sent in for 3 months.  Increased dose of Vraylar  to 4.5mg  daily.  Follow up in 3 months.  Call sooner if concerns arise.

## 2025-01-03 NOTE — Assessment & Plan Note (Addendum)
 Chronic.  Not able to get Trellegy due to cost.  Sample of Trellegy given in office. Has an appointment with Dr. Theotis in March.  Referral placed for pharmacy to help with cost.

## 2025-01-03 NOTE — Assessment & Plan Note (Signed)
 She is on a 28-day supply of morphine  which I am concerned about given chronic benzo use as well. I have placed a referral for pain management due to the chronic nature and high dose of medication.  Discussed with patient my concern for taking Benzos and Opiates together.   There is an issue with the pharmacy not having the order, possibly due to the high dose being flagged in the system.

## 2025-01-06 ENCOUNTER — Telehealth: Payer: Self-pay

## 2025-01-06 NOTE — Progress Notes (Unsigned)
 Care Guide Pharmacy Note  01/06/2025 Name: Felicia [name] MRN: [id number] DOB: 07-23-1971  Referred By: Melvin Pao, NP Reason for referral: Complex Care Management (Outreach to schedule with Pharm d )   Felicia [name] is a 54 y.o. year old female who is a primary care patient of Melvin Pao, NP.  Felicia [name] was referred to the pharmacist for assistance related to: emphysema  An unsuccessful telephone outreach was attempted today to contact the patient who was referred to the pharmacy team for assistance with medication assistance. Additional attempts will be made to contact the patient.  Jeoffrey Buffalo , RMA     [facility]  [facility] Guide  Direct Dial: [phone]  Website: [email or web address]

## 2025-01-07 ENCOUNTER — Other Ambulatory Visit (HOSPITAL_COMMUNITY): Payer: Self-pay

## 2025-01-07 ENCOUNTER — Telehealth: Payer: Self-pay

## 2025-01-07 ENCOUNTER — Ambulatory Visit: Admitting: Pediatrics

## 2025-01-07 NOTE — Telephone Encounter (Signed)
 Pharmacy Patient Advocate Encounter   Received notification from [facility] KEY that prior authorization for Fioricet is required/requested.   Insurance verification completed.   The patient is insured through [employer].   Per test claim: Per test claim, medication is not covered due to plan/benefit exclusion, PA not submitted at this time

## 2025-01-07 NOTE — Progress Notes (Signed)
 Care Guide Pharmacy Note  01/07/2025 Name: Felicia [name] MRN: [id number] DOB: 1971-08-31  Referred By: Melvin Pao, NP Reason for referral: Complex Care Management (Outreach to schedule with Pharm d )   Felicia [name] is a 54 y.o. year old female who is a primary care patient of Melvin Pao, NP.  Felicia [name] was referred to the pharmacist for assistance related to: emphysema  Successful contact was made with the patient to discuss pharmacy services including being ready for the pharmacist to call at least 5 minutes before the scheduled appointment time and to have medication bottles and any blood pressure readings ready for review. The patient agreed to meet with the pharmacist via telephone visit on (date/time).01/16/2025  Jeoffrey Buffalo , RMA     [place]  [facility], [facility] Guide  Direct Dial: [phone]  Website: [email or web address]

## 2025-01-09 NOTE — Telephone Encounter (Signed)
 Patient was contacted and I did inform her about insurance not covering the Fioricet. She stated she understood.

## 2025-01-09 NOTE — Telephone Encounter (Signed)
 Please let patient know this is not covered.  Ask her if she has tried other options for her migraines.  She does have the option to pay out of pocket for the medication if she chooses.

## 2025-01-14 ENCOUNTER — Other Ambulatory Visit (HOSPITAL_COMMUNITY): Payer: Self-pay

## 2025-01-14 ENCOUNTER — Telehealth: Payer: Self-pay

## 2025-01-14 NOTE — Telephone Encounter (Signed)
 Pharmacy Patient Advocate Encounter   Received notification from [employer] KEY that prior authorization for Butalbital -APAP-Caffeine  50-325-40MG  tablets  is required/requested.   Insurance verification completed.   The patient is insured through [facility].   Per test claim, medication is not covered due to plan/benefit exclusion, PA not submitted at this time

## 2025-01-14 NOTE — Telephone Encounter (Signed)
 Patient has been called and a message left for them to return the call to the office. Ok for E2C2 to review if/when they return the call. Please do not transfer to CAL rather send a CRM if needed only.  Called to advise medication PA has been denied by insurance. She can however still proceed with the medication and pay out of pocket using goodrx. Her current pharmacy may not be an option to using that and she will need to call a new pharmacy such as [facility], [facility] or [facility] and ask that they transfer the prescription from her current pharmacy to the new one of her choosing. We do not need to make this switch as the medication is not controlled. Patient just needs to call new pharmacy.   Costs with goodrx range from $18-$24 for 14 tablets as the prescription is wrote for.

## 2025-01-14 NOTE — Telephone Encounter (Signed)
 Pharmacy Patient Advocate Encounter   Received notification from [employer] KEY that prior authorization for Morphine  sulfate ER 30 MG Tabs is required/requested.   Insurance verification completed.   The patient is insured through [employer].   Per test claim: Refill too soon. PA is not needed at this time. Medication was filled 01/07/25. Next eligible fill date is 01/23/25.

## 2025-01-16 ENCOUNTER — Other Ambulatory Visit: Payer: Self-pay

## 2025-01-16 DIAGNOSIS — F3341 Major depressive disorder, recurrent, in partial remission: Secondary | ICD-10-CM

## 2025-01-16 DIAGNOSIS — J439 Emphysema, unspecified: Secondary | ICD-10-CM

## 2025-01-16 NOTE — Progress Notes (Signed)
 "  01/16/2025 Name: Felicia [name] MRN: [id number] DOB: [birth year]  Chief Complaint  Patient presents with   Medication Assistance   AUNESTI PELLEGRINO is a 54 y.o. year old female who presented for a telephone visit.   They were referred to the pharmacist by their PCP for assistance in managing medication access.   Subjective:  Care Team: Primary Care Provider: Melvin Pao, NP ; Next Scheduled Visit: 04/03/25  Medication Access/Adherence  Current Pharmacy:  [facility] [place], [place] - [address] [place] [address] Phone: [phone] Fax: [phone]  Patient reports affordability concerns with their medications: Yes  Patient reports access/transportation concerns to their pharmacy: No  Patient reports adherence concerns with their medications:  Yes    Medication Management: Current adherence strategy: Patient currently fills with [facility] and would prefer to stay with this pharmacy if able with new plan -Patient reports Good adherence to medications -Patient reports the following barriers to adherence: new insurance not covering some medications and others with high copays -Patient states bultalbital was denied by insurance, she cannot afford copay for Trelegy, and she is concerned about coverage of several other medications (venlafaxine, pregabalin, and Vraylar ) -Has applied for [employer] Extra help but has not received a response from [employer] yet  Objective:  Lab Results  Component Value Date   CREATININE 0.53 10/31/2018   BUN 17 10/31/2018   NA 136 10/31/2018   K 3.8 10/31/2018   CL 100 10/31/2018   CO2 27 10/31/2018   Medications Reviewed Today     Reviewed by Deanna Channing LABOR, RPH (Pharmacist) on 01/16/25 at [phone]  Med List Status: <None>   Medication Order Taking? Sig Documenting Provider Last Dose Status Informant  albuterol (PROVENTIL HFA;VENTOLIN HFA) 108 (90 Base) MCG/ACT inhaler [id number]  Inhale into the lungs. [provider]  Active   ALPRAZolam  (XANAX ) 0.5 MG tablet [id number] Yes Take 1 tablet (0.5 mg total) by mouth 4 (four) times daily as needed. Melvin Pao, NP  Active   butalbital -acetaminophen -caffeine  (FIORICET) 50-325-40 MG tablet [id number] Yes Take 1 tablet by mouth every 4 (four) hours as needed for headache. Melvin Pao, NP  Active   Cariprazine  HCl (VRAYLAR ) 4.5 MG CAPS [id number] Yes Take 1 capsule (4.5 mg total) by mouth daily. Melvin Pao, NP  Active   Fluticasone-Umeclidin-Vilant (TRELEGY ELLIPTA) 100-62.5-25 MCG/ACT AEPB [id number] Yes Inhale 1 puff into the lungs daily. [provider]  Active Self  ibuprofen  (ADVIL ) 800 MG tablet [id number]  Take 1 tablet (800 mg total) by mouth 3 (three) times daily. Herold Hadassah SQUIBB, MD  Active   montelukast (SINGULAIR) 10 MG tablet [id number] Yes Take 10 mg by mouth daily. [provider]  Active   morphine  (MS CONTIN ) 30 MG 12 hr tablet [id number] Yes TAKE 2 TABLETS BY MOUTH ONCE EVERY MORNING AND 1 TABLET AT BEDTIME Melvin Pao, NP  Active   morphine  (MS CONTIN ) 30 MG 12 hr tablet [id number]  TAKE 2 TABLETS BY MOUTH ONCE EVERY MORNING AND 1 TABLET AT BEDTIME Melvin Pao, NP  Active   morphine  (MS CONTIN ) 30 MG 12 hr tablet [id number]  TAKE 2 TABLETS BY MOUTH ONCE EVERY MORNING AND 1 TABLET AT BEDTIME Melvin Pao, NP  Active   pregabalin (LYRICA) 150 MG capsule [id number] Yes Take 150 mg by mouth 2 (two) times daily. [provider]  Active   traZODone  (DESYREL ) 100 MG tablet [id number] Yes Take 1 tablet (100 mg total) by mouth  at bedtime. Herold Hadassah SQUIBB, MD  Active   venlafaxine XR (EFFEXOR-XR) 150 MG 24 hr capsule [id number] Yes Take 150 mg by mouth daily. [provider]  Active   venlafaxine XR (EFFEXOR-XR) 75 MG 24 hr capsule [id number] Yes Take 75 mg by mouth daily with breakfast. [provider]  Active Self           Assessment/Plan:   Medication Management: -Test  claim for Trelegy reflects $12.65 copay, which reflects patient has been approved for Extra Help -Test claim for Vraylar  reflects that insurance will limit this medication to a 30 day supply at a time, but it will also be a $12.65 copay -Unable to run test claims for venlafaxine based on when insurance last covered, but these should be no more than $4-5 each every 90 days -Pregabalin is also covered by plan at $4-5 copay -Patient will use GoodRx coupon to get cheapest price on bultalbital -Contacted [facility], and they are refilling a 90 day supply of Trelegy and a 30 day supply of Vraylar  now for $12.65 each- sending MyChart message to make patient aware  Follow Up Plan: Provided patient with my direct number in case additional medication questions/concerns arise  Channing DELENA Mealing, PharmD, DPLA    "

## 2025-01-27 ENCOUNTER — Other Ambulatory Visit: Payer: Self-pay

## 2025-01-27 DIAGNOSIS — R911 Solitary pulmonary nodule: Secondary | ICD-10-CM

## 2025-01-27 DIAGNOSIS — Z87891 Personal history of nicotine dependence: Secondary | ICD-10-CM

## 2025-01-27 DIAGNOSIS — Z122 Encounter for screening for malignant neoplasm of respiratory organs: Secondary | ICD-10-CM

## 2025-02-06 ENCOUNTER — Ambulatory Visit

## 2025-04-03 ENCOUNTER — Encounter: Admitting: Nurse Practitioner
